# Patient Record
Sex: Female | Born: 1955 | Race: White | Hispanic: No | Marital: Married | Smoking: Never smoker
Health system: Southern US, Community
[De-identification: ages and names within clinical notes are randomized; demographics above are authoritative.]

## PROBLEM LIST (undated history)

## (undated) DIAGNOSIS — F419 Anxiety disorder, unspecified: Secondary | ICD-10-CM

## (undated) DIAGNOSIS — E2740 Unspecified adrenocortical insufficiency: Secondary | ICD-10-CM

## (undated) DIAGNOSIS — I219 Acute myocardial infarction, unspecified: Secondary | ICD-10-CM

## (undated) DIAGNOSIS — K219 Gastro-esophageal reflux disease without esophagitis: Secondary | ICD-10-CM

## (undated) DIAGNOSIS — E162 Hypoglycemia, unspecified: Secondary | ICD-10-CM

## (undated) DIAGNOSIS — M4802 Spinal stenosis, cervical region: Secondary | ICD-10-CM

## (undated) DIAGNOSIS — M1990 Unspecified osteoarthritis, unspecified site: Secondary | ICD-10-CM

## (undated) DIAGNOSIS — G459 Transient cerebral ischemic attack, unspecified: Secondary | ICD-10-CM

## (undated) DIAGNOSIS — T8859XA Other complications of anesthesia, initial encounter: Secondary | ICD-10-CM

## (undated) DIAGNOSIS — R7303 Prediabetes: Secondary | ICD-10-CM

## (undated) DIAGNOSIS — G4730 Sleep apnea, unspecified: Secondary | ICD-10-CM

## (undated) DIAGNOSIS — J189 Pneumonia, unspecified organism: Secondary | ICD-10-CM

## (undated) DIAGNOSIS — Z8719 Personal history of other diseases of the digestive system: Secondary | ICD-10-CM

## (undated) DIAGNOSIS — M4316 Spondylolisthesis, lumbar region: Secondary | ICD-10-CM

## (undated) DIAGNOSIS — F329 Major depressive disorder, single episode, unspecified: Secondary | ICD-10-CM

## (undated) DIAGNOSIS — E0590 Thyrotoxicosis, unspecified without thyrotoxic crisis or storm: Secondary | ICD-10-CM

## (undated) DIAGNOSIS — F32A Depression, unspecified: Secondary | ICD-10-CM

## (undated) DIAGNOSIS — M797 Fibromyalgia: Secondary | ICD-10-CM

## (undated) DIAGNOSIS — M2550 Pain in unspecified joint: Secondary | ICD-10-CM

## (undated) DIAGNOSIS — I10 Essential (primary) hypertension: Secondary | ICD-10-CM

## (undated) HISTORY — PX: OVARIAN CYST REMOVAL: SHX89

## (undated) HISTORY — PX: TONSILLECTOMY: SUR1361

## (undated) HISTORY — PX: TOE SURGERY: SHX1073

## (undated) HISTORY — PX: HIP ARTHROSCOPY W/ LABRAL REPAIR: SHX1750

## (undated) HISTORY — PX: DILATION AND CURETTAGE OF UTERUS: SHX78

## (undated) HISTORY — PX: APPENDECTOMY: SHX54

## (undated) HISTORY — PX: EYE SURGERY: SHX253

## (undated) HISTORY — PX: COLONOSCOPY: SHX174

## (undated) HISTORY — PX: JOINT REPLACEMENT: SHX530

## (undated) HISTORY — PX: CARDIAC CATHETERIZATION: SHX172

## (undated) HISTORY — PX: OOPHORECTOMY: SHX86

## (undated) HISTORY — PX: CHOLECYSTECTOMY: SHX55

## (undated) HISTORY — PX: BACK SURGERY: SHX140

## (undated) HISTORY — PX: ABDOMINAL HYSTERECTOMY: SHX81

---

## 2017-08-19 ENCOUNTER — Other Ambulatory Visit: Payer: Self-pay | Admitting: Neurosurgery

## 2017-09-13 NOTE — Pre-Procedure Instructions (Signed)
Felicia Ponce  09/13/2017      [facility] [id number] - [place], Felicia Ponce Phone: [phone] Fax: [phone]    Your procedure is scheduled on Monday, September 20, 2017  [facility] "A" at 6:00AM  Call this number if you have problems the morning of surgery:  [phone]   Remember:  Do not eat food or drink liquids after midnight.  Take these medicines the morning of surgery with A SIP OF WATER: Dexamethasone (DECADRON), Escitalopram (LEXAPRO), Methimazole (TAPAZOLE),  Orphenadrine (NORFLEX), Pantoprazole (PROTONIX), and RESTASIS. If needed HYDROcodone-acetaminophen (NORCO/VICODIN) for pain.  As of today, stop taking all Aspirins, Vitamins, Fish oils, and Herbal medications. Also stop all NSAIDS i.e. Advil, Ibuprofen, Motrin, Aleve, Anaprox, Naproxen, BC and Goody Powders.   Do not wear jewelry, make-up or nail polish.  Do not wear lotions, powders, perfumes, or deodorant.  Do not shave 48 hours prior to surgery.    Do not bring valuables to the hospital.  [facility] is not responsible for any belongings or valuables.  Contacts, dentures or bridgework may not be worn into surgery.  Leave your suitcase in the car.  After surgery it may be brought to your room.  For patients admitted to the hospital, discharge time will be determined by your treatment team.  Patients discharged the day of surgery will not be allowed to drive home.   Special instructions:  [facility]- Preparing For Surgery  Before surgery, you can play an important role. Because skin is not sterile, your skin needs to be as free of germs as possible. You can reduce the number of germs on your skin by washing with CHG (chlorahexidine gluconate) Soap before surgery.  CHG is an antiseptic cleaner which kills germs and bonds with the skin to continue killing germs even after  washing.  Please do not use if you have an allergy to CHG or antibacterial soaps. If your skin becomes reddened/irritated stop using the CHG.  Do not shave (including legs and underarms) for at least 48 hours prior to first CHG shower. It is OK to shave your face.  Please follow these instructions carefully.   1. Shower the NIGHT BEFORE SURGERY and the MORNING OF SURGERY with CHG.   2. If you chose to wash your hair, wash your hair first as usual with your normal shampoo.  3. After you shampoo, rinse your hair and body thoroughly to remove the shampoo.  4. Use CHG as you would any other liquid soap. You can apply CHG directly to the skin and wash gently with a scrungie or a clean washcloth.   5. Apply the CHG Soap to your body ONLY FROM THE NECK DOWN.  Do not use on open wounds or open sores. Avoid contact with your eyes, ears, mouth and genitals (private parts). Wash Face and genitals (private parts)  with your normal soap.  6. Wash thoroughly, paying special attention to the area where your surgery will be performed.  7. Thoroughly rinse your body with warm water from the neck down.  8. DO NOT shower/wash with your normal soap after using and rinsing off the CHG Soap.  9. Pat yourself dry with a CLEAN TOWEL.  10. Wear CLEAN PAJAMAS to bed the night before surgery, wear comfortable clothes the morning of surgery  11. Place CLEAN SHEETS on your bed  the night of your first shower and DO NOT SLEEP WITH PETS.  Day of Surgery: Do not apply any deodorants/lotions. Please wear clean clothes to the hospital/surgery center.    Please read over the following fact sheets that you were given. Pain Booklet, Coughing and Deep Breathing, MRSA Information and Surgical Site Infection Prevention

## 2017-09-14 ENCOUNTER — Other Ambulatory Visit: Payer: Self-pay

## 2017-09-14 ENCOUNTER — Encounter (HOSPITAL_COMMUNITY): Payer: Self-pay

## 2017-09-14 ENCOUNTER — Encounter (HOSPITAL_COMMUNITY)
Admission: RE | Admit: 2017-09-14 | Discharge: 2017-09-14 | Disposition: A | Discharge status: Home / Self Care | Payer: Medicare Other | Source: Ambulatory Visit | Attending: Neurosurgery | Admitting: Neurosurgery

## 2017-09-14 DIAGNOSIS — I10 Essential (primary) hypertension: Secondary | ICD-10-CM | POA: Insufficient documentation

## 2017-09-14 DIAGNOSIS — I2510 Atherosclerotic heart disease of native coronary artery without angina pectoris: Secondary | ICD-10-CM | POA: Insufficient documentation

## 2017-09-14 DIAGNOSIS — Z8673 Personal history of transient ischemic attack (TIA), and cerebral infarction without residual deficits: Secondary | ICD-10-CM | POA: Diagnosis not present

## 2017-09-14 DIAGNOSIS — K449 Diaphragmatic hernia without obstruction or gangrene: Secondary | ICD-10-CM | POA: Diagnosis not present

## 2017-09-14 DIAGNOSIS — K219 Gastro-esophageal reflux disease without esophagitis: Secondary | ICD-10-CM | POA: Insufficient documentation

## 2017-09-14 DIAGNOSIS — Z01812 Encounter for preprocedural laboratory examination: Secondary | ICD-10-CM | POA: Insufficient documentation

## 2017-09-14 DIAGNOSIS — F329 Major depressive disorder, single episode, unspecified: Secondary | ICD-10-CM | POA: Diagnosis not present

## 2017-09-14 DIAGNOSIS — E0590 Thyrotoxicosis, unspecified without thyrotoxic crisis or storm: Secondary | ICD-10-CM | POA: Insufficient documentation

## 2017-09-14 DIAGNOSIS — G4730 Sleep apnea, unspecified: Secondary | ICD-10-CM | POA: Insufficient documentation

## 2017-09-14 DIAGNOSIS — Z79899 Other long term (current) drug therapy: Secondary | ICD-10-CM | POA: Insufficient documentation

## 2017-09-14 HISTORY — DX: Sleep apnea, unspecified: G47.30

## 2017-09-14 HISTORY — DX: Depression, unspecified: F32.A

## 2017-09-14 HISTORY — DX: Gastro-esophageal reflux disease without esophagitis: K21.9

## 2017-09-14 HISTORY — DX: Unspecified osteoarthritis, unspecified site: M19.90

## 2017-09-14 HISTORY — DX: Fibromyalgia: M79.7

## 2017-09-14 HISTORY — DX: Transient cerebral ischemic attack, unspecified: G45.9

## 2017-09-14 HISTORY — DX: Hypoglycemia, unspecified: E16.2

## 2017-09-14 HISTORY — DX: Spondylolisthesis, lumbar region: M43.16

## 2017-09-14 HISTORY — DX: Acute myocardial infarction, unspecified: I21.9

## 2017-09-14 HISTORY — DX: Personal history of other diseases of the digestive system: Z87.19

## 2017-09-14 HISTORY — DX: Major depressive disorder, single episode, unspecified: F32.9

## 2017-09-14 HISTORY — DX: Essential (primary) hypertension: I10

## 2017-09-14 HISTORY — DX: Thyrotoxicosis, unspecified without thyrotoxic crisis or storm: E05.90

## 2017-09-14 HISTORY — DX: Pneumonia, unspecified organism: J18.9

## 2017-09-14 HISTORY — DX: Unspecified adrenocortical insufficiency: E27.40

## 2017-09-14 LAB — CBC WITH DIFFERENTIAL/PLATELET
Basophils Absolute: 0.1 10*3/uL (ref 0.0–0.1)
Basophils Relative: 1 %
EOS ABS: 0.1 10*3/uL (ref 0.0–0.7)
EOS PCT: 1 %
HCT: 45 % (ref 36.0–46.0)
Hemoglobin: 14.7 g/dL (ref 12.0–15.0)
LYMPHS ABS: 2.2 10*3/uL (ref 0.7–4.0)
Lymphocytes Relative: 17 %
MCH: 29.5 pg (ref 26.0–34.0)
MCHC: 32.7 g/dL (ref 30.0–36.0)
MCV: 90.2 fL (ref 78.0–100.0)
MONOS PCT: 13 %
Monocytes Absolute: 1.7 10*3/uL — ABNORMAL HIGH (ref 0.1–1.0)
Neutro Abs: 9 10*3/uL — ABNORMAL HIGH (ref 1.7–7.7)
Neutrophils Relative %: 68 %
PLATELETS: 290 10*3/uL (ref 150–400)
RBC: 4.99 MIL/uL (ref 3.87–5.11)
RDW: 13.7 % (ref 11.5–15.5)
WBC: 13.1 10*3/uL — AB (ref 4.0–10.5)

## 2017-09-14 LAB — BASIC METABOLIC PANEL
ANION GAP: 9 (ref 5–15)
BUN: 21 mg/dL — ABNORMAL HIGH (ref 6–20)
CHLORIDE: 106 mmol/L (ref 101–111)
CO2: 26 mmol/L (ref 22–32)
Calcium: 8.8 mg/dL — ABNORMAL LOW (ref 8.9–10.3)
Creatinine, Ser: 1.1 mg/dL — ABNORMAL HIGH (ref 0.44–1.00)
GFR calc non Af Amer: 52 mL/min — ABNORMAL LOW (ref >60)
Glucose, Bld: 81 mg/dL (ref 65–99)
Potassium: 3.6 mmol/L (ref 3.5–5.1)
Sodium: 141 mmol/L (ref 135–145)

## 2017-09-14 LAB — SURGICAL PCR SCREEN
MRSA, PCR: NEGATIVE
Staphylococcus aureus: POSITIVE — AB

## 2017-09-14 LAB — TYPE AND SCREEN
ABO/RH(D): A POS
Antibody Screen: NEGATIVE

## 2017-09-14 LAB — ABO/RH: ABO/RH(D): A POS

## 2017-09-14 NOTE — Progress Notes (Signed)
Pt notified of +Staph results. [facility] [phone].

## 2017-09-14 NOTE — Progress Notes (Addendum)
PCP - Dr. Floyde ParkinsNewmark  Cardiologist - Dr. Eula ListenVybrial-Requested Cardiac Clearance  Endocrine-Dr. Demetra ShinerGulley  Chest x-ray - 05/26/17 (CE)-Requested  EKG - 05/26/17 (CE) -Requested  Stress Test - 05/27/17 (CE)-Requested  ECHO - Denies  Cardiac Cath - 4-5 yrs ago, Can not remember where it was done, but sts it was negative for blockage  Sleep Study - Yes CPAP - Pt has CPAP machine, but can not tolerate it.  LABS-Pending: CBC w/D, BMP, T/S  Chart will be given to anesthesia for review due to medical history and requested records.  Pt denies having chest pain, sob, or fever at this time. All instructions explained to the pt, with a verbal understanding of the material. Pt agrees to go over the instructions while at home for a better understanding. The opportunity to ask questions was provided.

## 2017-09-15 NOTE — Progress Notes (Addendum)
Anesthesia Chart Review:  Pt is a 61 year old female scheduled for L4-5 PLIF on 09/20/2017 with Julio SicksHenry Pool, MD.   - Endocrinologist is Santiago GladPaul Gulley, MD who cleared pt for surgery noting "in holding area before surgery she should be given 50 mg hydrocortisone IV and then 25 mg every 8 hours for 24 hours. On day following surgery if she is taking oral meds she can just resume her usual replacement steroids. If she remains acutely ill for example persistent nausea or fever, then continue 25 mg hydrocortisone IV twice daily until her acute illness resolves and resume her baseline replacement"  - Cardiologist is Frazier Butthomas Vybiral, MD who has cleared pt for surgery.   PMH includes:  HTN, TIA, hyperthyroidism, adrenal insufficiency, OSA, GERD. Never smoker. BMI 33  - [facility]. Stress test normal.   Medications include: Dexamethasone, methimazole, metoprolol, Protonix  BP 132/69   Pulse 67   Temp 36.7 C   Resp 20   Ht 5\' 6"  (1.676 m)   Wt 205 lb 6.4 oz (93.2 kg)   SpO2 96%   BMI 33.15 kg/m   Preoperative labs reviewed.    1 view CXR 05/26/17 (care everywhere):  - No active intrathoracic disease.  EKG 05/26/17 [facility]):  - NSR. Inferior infarct, age undetermined - Report in care everywhere.  Tracking requested.   Nuclear stress test 05/27/17 (care everywhere):  - Exam within normal limits.  Echo 04/22/17 [facility]):  1. Normal LV size and systolic function. LVH. Diastolic dysfunction. 2. Mild mitral regurgitation. 3. Moderate tricuspid regurgitation. 4. Mild to moderate pulmonary hypertension.  Cardiac cath 01/16/13 (care everywhere):  - Mild non-obstructive ASCAD (mid RCA 10%) - Ejection Fraction: 60% - Wall Motion: Normal   If no changes, I anticipate pt can proceed with surgery as scheduled.   Rica Mastngela Tracker Mance, FNP-BC [facility]Anesthesiology Phone:  [phone] 09/15/2017 1:12 PM

## 2017-09-20 ENCOUNTER — Inpatient Hospital Stay (HOSPITAL_COMMUNITY): Payer: Medicare Other

## 2017-09-20 ENCOUNTER — Inpatient Hospital Stay (HOSPITAL_COMMUNITY)
Admission: RE | Admit: 2017-09-20 | Discharge: 2017-09-22 | DRG: 454 | Disposition: A | Discharge status: Home / Self Care | Payer: Medicare Other | Source: Ambulatory Visit | Attending: Neurosurgery | Admitting: Neurosurgery

## 2017-09-20 ENCOUNTER — Inpatient Hospital Stay (HOSPITAL_COMMUNITY): Payer: Medicare Other | Admitting: Emergency Medicine

## 2017-09-20 ENCOUNTER — Encounter (HOSPITAL_COMMUNITY)
Admission: RE | Disposition: A | Discharge status: Home / Self Care | Payer: Self-pay | Source: Ambulatory Visit | Attending: Neurosurgery

## 2017-09-20 ENCOUNTER — Encounter (HOSPITAL_COMMUNITY): Payer: Self-pay | Admitting: *Deleted

## 2017-09-20 DIAGNOSIS — Z79899 Other long term (current) drug therapy: Secondary | ICD-10-CM | POA: Diagnosis not present

## 2017-09-20 DIAGNOSIS — E2740 Unspecified adrenocortical insufficiency: Secondary | ICD-10-CM | POA: Diagnosis present

## 2017-09-20 DIAGNOSIS — Z888 Allergy status to other drugs, medicaments and biological substances status: Secondary | ICD-10-CM

## 2017-09-20 DIAGNOSIS — Z886 Allergy status to analgesic agent status: Secondary | ICD-10-CM | POA: Diagnosis not present

## 2017-09-20 DIAGNOSIS — M4310 Spondylolisthesis, site unspecified: Secondary | ICD-10-CM | POA: Diagnosis present

## 2017-09-20 DIAGNOSIS — Z91040 Latex allergy status: Secondary | ICD-10-CM | POA: Diagnosis not present

## 2017-09-20 DIAGNOSIS — Z881 Allergy status to other antibiotic agents status: Secondary | ICD-10-CM

## 2017-09-20 DIAGNOSIS — M4316 Spondylolisthesis, lumbar region: Secondary | ICD-10-CM | POA: Diagnosis present

## 2017-09-20 DIAGNOSIS — G4730 Sleep apnea, unspecified: Secondary | ICD-10-CM | POA: Diagnosis present

## 2017-09-20 DIAGNOSIS — I252 Old myocardial infarction: Secondary | ICD-10-CM

## 2017-09-20 DIAGNOSIS — Z882 Allergy status to sulfonamides status: Secondary | ICD-10-CM | POA: Diagnosis not present

## 2017-09-20 DIAGNOSIS — Z90710 Acquired absence of both cervix and uterus: Secondary | ICD-10-CM | POA: Diagnosis not present

## 2017-09-20 DIAGNOSIS — Z8673 Personal history of transient ischemic attack (TIA), and cerebral infarction without residual deficits: Secondary | ICD-10-CM

## 2017-09-20 DIAGNOSIS — K219 Gastro-esophageal reflux disease without esophagitis: Secondary | ICD-10-CM | POA: Diagnosis present

## 2017-09-20 DIAGNOSIS — I10 Essential (primary) hypertension: Secondary | ICD-10-CM | POA: Diagnosis present

## 2017-09-20 DIAGNOSIS — Z880 Allergy status to penicillin: Secondary | ICD-10-CM

## 2017-09-20 DIAGNOSIS — Z419 Encounter for procedure for purposes other than remedying health state, unspecified: Secondary | ICD-10-CM

## 2017-09-20 SURGERY — POSTERIOR LUMBAR FUSION 1 LEVEL
Anesthesia: General | Site: Back

## 2017-09-20 MED ORDER — ORPHENADRINE CITRATE ER 100 MG PO TB12
100.0000 mg | ORAL_TABLET | Freq: Two times a day (BID) | ORAL | Status: DC
  Administered 2017-09-20 – 2017-09-22 (×4): 100 mg via ORAL
  Filled 2017-09-20 (×4): qty 1

## 2017-09-20 MED ORDER — DIAZEPAM 5 MG PO TABS
ORAL_TABLET | ORAL | Status: AC
  Filled 2017-09-20: qty 2

## 2017-09-20 MED ORDER — HYDROCORTISONE NA SUCCINATE PF 100 MG IJ SOLR
50.0000 mg | Freq: Three times a day (TID) | INTRAMUSCULAR | Status: AC
  Administered 2017-09-20 – 2017-09-21 (×3): 50 mg via INTRAVENOUS
  Filled 2017-09-20 (×3): qty 2

## 2017-09-20 MED ORDER — PANTOPRAZOLE SODIUM 40 MG PO TBEC
40.0000 mg | DELAYED_RELEASE_TABLET | Freq: Two times a day (BID) | ORAL | Status: DC
  Administered 2017-09-20 – 2017-09-22 (×4): 40 mg via ORAL
  Filled 2017-09-20 (×4): qty 1

## 2017-09-20 MED ORDER — POLYVINYL ALCOHOL 1.4 % OP SOLN
1.0000 [drp] | Freq: Every day | OPHTHALMIC | Status: DC
  Administered 2017-09-21: 1 [drp] via OPHTHALMIC
  Filled 2017-09-20: qty 15

## 2017-09-20 MED ORDER — LACTATED RINGERS IV SOLN
INTRAVENOUS | Status: DC | PRN
  Administered 2017-09-20 (×2): via INTRAVENOUS

## 2017-09-20 MED ORDER — LACTATED RINGERS IV SOLN
Freq: Once | INTRAVENOUS | Status: AC
  Administered 2017-09-20: 07:00:00 via INTRAVENOUS

## 2017-09-20 MED ORDER — DIPHENHYDRAMINE HCL 25 MG PO CAPS
25.0000 mg | ORAL_CAPSULE | Freq: Four times a day (QID) | ORAL | Status: DC | PRN
  Administered 2017-09-20: 25 mg via ORAL
  Filled 2017-09-20: qty 1

## 2017-09-20 MED ORDER — HYDROMORPHONE HCL 1 MG/ML IJ SOLN
INTRAMUSCULAR | Status: AC
  Administered 2017-09-20: 11:00:00
  Filled 2017-09-20: qty 1

## 2017-09-20 MED ORDER — EPHEDRINE 5 MG/ML INJ
INTRAVENOUS | Status: AC
  Filled 2017-09-20: qty 10

## 2017-09-20 MED ORDER — BISACODYL 10 MG RE SUPP: 10.0000 mg | Freq: Every day | RECTAL | Status: DC | PRN

## 2017-09-20 MED ORDER — ONDANSETRON HCL 4 MG/2ML IJ SOLN: 4.0000 mg | Freq: Four times a day (QID) | INTRAMUSCULAR | Status: DC | PRN

## 2017-09-20 MED ORDER — PHENYLEPHRINE HCL 10 MG/ML IJ SOLN
INTRAMUSCULAR | Status: DC | PRN
  Administered 2017-09-20: 20 ug/min via INTRAVENOUS

## 2017-09-20 MED ORDER — MIDAZOLAM HCL 5 MG/5ML IJ SOLN
INTRAMUSCULAR | Status: DC | PRN
Start: 2017-09-20 — End: 2017-09-20
  Administered 2017-09-20: 2 mg via INTRAVENOUS

## 2017-09-20 MED ORDER — MELATONIN 3 MG PO TABS
9.0000 mg | ORAL_TABLET | Freq: Every day | ORAL | Status: DC
  Administered 2017-09-20 – 2017-09-21 (×2): 9 mg via ORAL
  Filled 2017-09-20 (×2): qty 3

## 2017-09-20 MED ORDER — OXYCODONE HCL 5 MG PO TABS
10.0000 mg | ORAL_TABLET | ORAL | Status: DC | PRN
  Administered 2017-09-20 – 2017-09-21 (×6): 10 mg via ORAL
  Filled 2017-09-20 (×6): qty 2

## 2017-09-20 MED ORDER — ROCURONIUM BROMIDE 100 MG/10ML IV SOLN
INTRAVENOUS | Status: DC | PRN
  Administered 2017-09-20: 50 mg via INTRAVENOUS

## 2017-09-20 MED ORDER — PROMETHAZINE HCL 25 MG/ML IJ SOLN: 6.2500 mg | INTRAMUSCULAR | Status: DC | PRN

## 2017-09-20 MED ORDER — DIAZEPAM 5 MG PO TABS
5.0000 mg | ORAL_TABLET | Freq: Four times a day (QID) | ORAL | Status: DC | PRN
  Administered 2017-09-20: 5 mg via ORAL
  Administered 2017-09-21 – 2017-09-22 (×3): 10 mg via ORAL
  Filled 2017-09-20: qty 1
  Filled 2017-09-20 (×3): qty 2

## 2017-09-20 MED ORDER — DEXAMETHASONE SODIUM PHOSPHATE 10 MG/ML IJ SOLN
10.0000 mg | INTRAMUSCULAR | Status: AC
  Administered 2017-09-20: 10 mg via INTRAVENOUS
  Filled 2017-09-20: qty 1

## 2017-09-20 MED ORDER — MIDAZOLAM HCL 2 MG/2ML IJ SOLN
INTRAMUSCULAR | Status: AC
  Filled 2017-09-20: qty 2

## 2017-09-20 MED ORDER — ONDANSETRON HCL 4 MG/2ML IJ SOLN
INTRAMUSCULAR | Status: AC
  Filled 2017-09-20: qty 2

## 2017-09-20 MED ORDER — MENTHOL 3 MG MT LOZG: 1.0000 | LOZENGE | OROMUCOSAL | Status: DC | PRN

## 2017-09-20 MED ORDER — CYCLOSPORINE 0.05 % OP EMUL
1.0000 [drp] | Freq: Two times a day (BID) | OPHTHALMIC | Status: DC
  Administered 2017-09-20 – 2017-09-22 (×4): 1 [drp] via OPHTHALMIC
  Filled 2017-09-20 (×4): qty 1

## 2017-09-20 MED ORDER — ONDANSETRON HCL 4 MG/2ML IJ SOLN
INTRAMUSCULAR | Status: DC | PRN
  Administered 2017-09-20: 4 mg via INTRAVENOUS

## 2017-09-20 MED ORDER — CHLORHEXIDINE GLUCONATE CLOTH 2 % EX PADS: 6.0000 | MEDICATED_PAD | Freq: Once | CUTANEOUS | Status: DC

## 2017-09-20 MED ORDER — DOXEPIN HCL 25 MG PO CAPS
25.0000 mg | ORAL_CAPSULE | Freq: Every evening | ORAL | Status: DC | PRN
  Administered 2017-09-21: 25 mg via ORAL
  Filled 2017-09-20: qty 1

## 2017-09-20 MED ORDER — HYDROCORTISONE NA SUCCINATE PF 100 MG IJ SOLR
INTRAMUSCULAR | Status: DC | PRN
  Administered 2017-09-20: 50 mg via INTRAVENOUS

## 2017-09-20 MED ORDER — METOPROLOL SUCCINATE ER 50 MG PO TB24
50.0000 mg | ORAL_TABLET | Freq: Every evening | ORAL | Status: DC
  Administered 2017-09-20 – 2017-09-21 (×2): 50 mg via ORAL
  Filled 2017-09-20 (×2): qty 1

## 2017-09-20 MED ORDER — VANCOMYCIN HCL 1000 MG IV SOLR
INTRAVENOUS | Status: DC | PRN
  Administered 2017-09-20: 1000 mg via TOPICAL

## 2017-09-20 MED ORDER — SODIUM CHLORIDE 0.9% FLUSH: 3.0000 mL | Freq: Two times a day (BID) | INTRAVENOUS | Status: DC

## 2017-09-20 MED ORDER — THROMBIN (RECOMBINANT) 20000 UNITS EX SOLR
CUTANEOUS | Status: DC | PRN
  Administered 2017-09-20: 20 mL via TOPICAL

## 2017-09-20 MED ORDER — ONDANSETRON HCL 4 MG PO TABS: 4.0000 mg | ORAL_TABLET | Freq: Four times a day (QID) | ORAL | Status: DC | PRN

## 2017-09-20 MED ORDER — STERILE WATER FOR IRRIGATION IR SOLN
Status: DC | PRN
  Administered 2017-09-20: 1000 mL

## 2017-09-20 MED ORDER — OXYCODONE HCL 5 MG/5ML PO SOLN
5.0000 mg | Freq: Once | ORAL | Status: DC | PRN
Start: 2017-09-20 — End: 2017-09-20

## 2017-09-20 MED ORDER — EPHEDRINE SULFATE 50 MG/ML IJ SOLN
INTRAMUSCULAR | Status: DC | PRN
  Administered 2017-09-20: 10 mg via INTRAVENOUS

## 2017-09-20 MED ORDER — BUPIVACAINE HCL (PF) 0.25 % IJ SOLN
INTRAMUSCULAR | Status: DC | PRN
  Administered 2017-09-20: 30 mL

## 2017-09-20 MED ORDER — VANCOMYCIN HCL 1000 MG IV SOLR
INTRAVENOUS | Status: DC | PRN
  Administered 2017-09-20: 1000 mg via INTRAVENOUS

## 2017-09-20 MED ORDER — PHENOL 1.4 % MT LIQD: 1.0000 | OROMUCOSAL | Status: DC | PRN

## 2017-09-20 MED ORDER — OXYCODONE HCL 5 MG PO TABS
ORAL_TABLET | ORAL | Status: AC
  Administered 2017-09-20: 5 mg
  Filled 2017-09-20: qty 2

## 2017-09-20 MED ORDER — LIDOCAINE HCL (CARDIAC) 20 MG/ML IV SOLN
INTRAVENOUS | Status: DC | PRN
  Administered 2017-09-20: 100 mg via INTRAVENOUS

## 2017-09-20 MED ORDER — HYDROMORPHONE HCL 1 MG/ML IJ SOLN
0.2500 mg | INTRAMUSCULAR | Status: DC | PRN
  Administered 2017-09-20 (×4): 0.5 mg via INTRAVENOUS

## 2017-09-20 MED ORDER — SUGAMMADEX SODIUM 200 MG/2ML IV SOLN
INTRAVENOUS | Status: DC | PRN
  Administered 2017-09-20: 200 mg via INTRAVENOUS

## 2017-09-20 MED ORDER — HYDROCODONE-ACETAMINOPHEN 10-325 MG PO TABS
1.0000 | ORAL_TABLET | ORAL | Status: DC | PRN
  Administered 2017-09-21 – 2017-09-22 (×2): 1 via ORAL
  Filled 2017-09-20 (×2): qty 1

## 2017-09-20 MED ORDER — THROMBIN (RECOMBINANT) 20000 UNITS EX SOLR
CUTANEOUS | Status: AC
  Filled 2017-09-20: qty 20000

## 2017-09-20 MED ORDER — DEXAMETHASONE 0.75 MG PO TABS
0.7500 mg | ORAL_TABLET | Freq: Every day | ORAL | Status: DC
  Administered 2017-09-20 – 2017-09-22 (×3): 0.75 mg via ORAL
  Filled 2017-09-20 (×3): qty 1

## 2017-09-20 MED ORDER — VANCOMYCIN HCL 1000 MG IV SOLR
INTRAVENOUS | Status: AC
  Filled 2017-09-20: qty 1000

## 2017-09-20 MED ORDER — POLYETHYLENE GLYCOL 3350 17 G PO PACK
17.0000 g | PACK | Freq: Every day | ORAL | Status: DC | PRN
  Administered 2017-09-22: 17 g via ORAL
  Filled 2017-09-20: qty 1

## 2017-09-20 MED ORDER — VANCOMYCIN HCL IN DEXTROSE 1-5 GM/200ML-% IV SOLN
INTRAVENOUS | Status: AC
  Filled 2017-09-20: qty 200

## 2017-09-20 MED ORDER — SODIUM CHLORIDE 0.9 % IR SOLN
Status: DC | PRN
  Administered 2017-09-20: 500 mL

## 2017-09-20 MED ORDER — OXYCODONE HCL 5 MG PO TABS
ORAL_TABLET | ORAL | Status: AC
  Administered 2017-09-20: 11:00:00
  Filled 2017-09-20: qty 2

## 2017-09-20 MED ORDER — FENTANYL CITRATE (PF) 100 MCG/2ML IJ SOLN
INTRAMUSCULAR | Status: DC | PRN
  Administered 2017-09-20 (×2): 25 ug via INTRAVENOUS
  Administered 2017-09-20: 75 ug via INTRAVENOUS
  Administered 2017-09-20: 25 ug via INTRAVENOUS
  Administered 2017-09-20: 50 ug via INTRAVENOUS

## 2017-09-20 MED ORDER — 0.9 % SODIUM CHLORIDE (POUR BTL) OPTIME
TOPICAL | Status: DC | PRN
  Administered 2017-09-20: 1000 mL

## 2017-09-20 MED ORDER — BUPIVACAINE HCL (PF) 0.25 % IJ SOLN
INTRAMUSCULAR | Status: AC
  Filled 2017-09-20: qty 30

## 2017-09-20 MED ORDER — HYDROMORPHONE HCL 1 MG/ML IJ SOLN
1.0000 mg | INTRAMUSCULAR | Status: DC | PRN
  Administered 2017-09-20 – 2017-09-21 (×4): 1 mg via INTRAVENOUS
  Filled 2017-09-20 (×4): qty 1

## 2017-09-20 MED ORDER — SODIUM CHLORIDE 0.9% FLUSH: 3.0000 mL | INTRAVENOUS | Status: DC | PRN

## 2017-09-20 MED ORDER — PROPOFOL 10 MG/ML IV BOLUS
INTRAVENOUS | Status: DC | PRN
  Administered 2017-09-20: 140 mg via INTRAVENOUS

## 2017-09-20 MED ORDER — OXYCODONE HCL 5 MG PO TABS: 5.0000 mg | ORAL_TABLET | Freq: Once | ORAL | Status: DC | PRN

## 2017-09-20 MED ORDER — METHIMAZOLE 5 MG PO TABS
5.0000 mg | ORAL_TABLET | Freq: Every day | ORAL | Status: DC
  Administered 2017-09-21 – 2017-09-22 (×2): 5 mg via ORAL
  Filled 2017-09-20 (×2): qty 1

## 2017-09-20 MED ORDER — ACETAMINOPHEN 325 MG PO TABS: 650.0000 mg | ORAL_TABLET | ORAL | Status: DC | PRN

## 2017-09-20 MED ORDER — FLEET ENEMA 7-19 GM/118ML RE ENEM: 1.0000 | ENEMA | Freq: Once | RECTAL | Status: DC | PRN

## 2017-09-20 MED ORDER — SODIUM CHLORIDE 0.9 % IV SOLN: 250.0000 mL | INTRAVENOUS | Status: DC

## 2017-09-20 MED ORDER — ACETAMINOPHEN 650 MG RE SUPP: 650.0000 mg | RECTAL | Status: DC | PRN

## 2017-09-20 MED ORDER — CALCIUM CARBONATE-VITAMIN D 500-200 MG-UNIT PO TABS
1.0000 | ORAL_TABLET | Freq: Every day | ORAL | Status: DC
  Administered 2017-09-21 – 2017-09-22 (×2): 1 via ORAL
  Filled 2017-09-20 (×3): qty 1

## 2017-09-20 MED ORDER — ESCITALOPRAM OXALATE 10 MG PO TABS
10.0000 mg | ORAL_TABLET | Freq: Every day | ORAL | Status: DC
  Administered 2017-09-21 – 2017-09-22 (×2): 10 mg via ORAL
  Filled 2017-09-20 (×2): qty 1

## 2017-09-20 MED ORDER — VANCOMYCIN HCL IN DEXTROSE 1-5 GM/200ML-% IV SOLN
1000.0000 mg | Freq: Once | INTRAVENOUS | Status: AC
  Administered 2017-09-20: 1000 mg via INTRAVENOUS
  Filled 2017-09-20: qty 200

## 2017-09-20 MED ORDER — PROPOFOL 10 MG/ML IV BOLUS
INTRAVENOUS | Status: AC
  Filled 2017-09-20: qty 20

## 2017-09-20 MED ORDER — GABAPENTIN 300 MG PO CAPS
300.0000 mg | ORAL_CAPSULE | Freq: Every day | ORAL | Status: DC
  Administered 2017-09-20 – 2017-09-21 (×2): 300 mg via ORAL
  Filled 2017-09-20 (×2): qty 1

## 2017-09-20 MED ORDER — FENTANYL CITRATE (PF) 250 MCG/5ML IJ SOLN
INTRAMUSCULAR | Status: AC
  Filled 2017-09-20: qty 5

## 2017-09-20 MED ORDER — SUCCINYLCHOLINE CHLORIDE 200 MG/10ML IV SOSY
PREFILLED_SYRINGE | INTRAVENOUS | Status: AC
  Filled 2017-09-20: qty 10

## 2017-09-20 SURGICAL SUPPLY — 70 items
BAG DECANTER FOR FLEXI CONT (MISCELLANEOUS) ×3 IMPLANT
BENZOIN TINCTURE PRP APPL 2/3 (GAUZE/BANDAGES/DRESSINGS) ×3 IMPLANT
BLADE CLIPPER SURG (BLADE) IMPLANT
BUR CUTTER 7.0 ROUND (BURR) IMPLANT
BUR MATCHSTICK NEURO 3.0 LAGG (BURR) ×3 IMPLANT
CANISTER SUCT 3000ML PPV (MISCELLANEOUS) ×3 IMPLANT
CAP LCK SPNE (Orthopedic Implant) ×4 IMPLANT
CAP LOCK SPINE RADIUS (Orthopedic Implant) ×4 IMPLANT
CAP LOCKING (Orthopedic Implant) ×8 IMPLANT
CARTRIDGE OIL MAESTRO DRILL (MISCELLANEOUS) ×1 IMPLANT
CLOSURE STERI-STRIP 1/2X4 (GAUZE/BANDAGES/DRESSINGS) ×1
CLOSURE WOUND 1/2 X4 (GAUZE/BANDAGES/DRESSINGS) ×2
CLSR STERI-STRIP ANTIMIC 1/2X4 (GAUZE/BANDAGES/DRESSINGS) ×2 IMPLANT
CONT SPEC 4OZ CLIKSEAL STRL BL (MISCELLANEOUS) ×3 IMPLANT
COVER BACK TABLE 60X90IN (DRAPES) ×3 IMPLANT
DECANTER SPIKE VIAL GLASS SM (MISCELLANEOUS) ×3 IMPLANT
DERMABOND ADVANCED (GAUZE/BANDAGES/DRESSINGS) ×2
DERMABOND ADVANCED .7 DNX12 (GAUZE/BANDAGES/DRESSINGS) ×1 IMPLANT
DEVICE INTERBODY ELEVATE 23X9 (Cage) ×6 IMPLANT
DIFFUSER DRILL AIR PNEUMATIC (MISCELLANEOUS) ×3 IMPLANT
DRAPE C-ARM 42X72 X-RAY (DRAPES) ×6 IMPLANT
DRAPE HALF SHEET 40X57 (DRAPES) IMPLANT
DRAPE LAPAROTOMY 100X72X124 (DRAPES) ×3 IMPLANT
DRAPE POUCH INSTRU U-SHP 10X18 (DRAPES) ×3 IMPLANT
DRAPE SURG 17X23 STRL (DRAPES) ×12 IMPLANT
DRSG OPSITE POSTOP 4X6 (GAUZE/BANDAGES/DRESSINGS) ×3 IMPLANT
DURAPREP 26ML APPLICATOR (WOUND CARE) ×3 IMPLANT
ELECT REM PT RETURN 9FT ADLT (ELECTROSURGICAL) ×3
ELECTRODE REM PT RTRN 9FT ADLT (ELECTROSURGICAL) ×1 IMPLANT
EVACUATOR 1/8 PVC DRAIN (DRAIN) IMPLANT
GAUZE SPONGE 4X4 12PLY STRL (GAUZE/BANDAGES/DRESSINGS) IMPLANT
GAUZE SPONGE 4X4 16PLY XRAY LF (GAUZE/BANDAGES/DRESSINGS) IMPLANT
GLOVE BIOGEL PI IND STRL 6.5 (GLOVE) ×2 IMPLANT
GLOVE BIOGEL PI INDICATOR 6.5 (GLOVE) ×4
GLOVE ECLIPSE 9.0 STRL (GLOVE) IMPLANT
GLOVE EXAM NITRILE LRG STRL (GLOVE) IMPLANT
GLOVE EXAM NITRILE XL STR (GLOVE) IMPLANT
GLOVE EXAM NITRILE XS STR PU (GLOVE) ×6 IMPLANT
GLOVE SS PI 9.0 STRL (GLOVE) ×6 IMPLANT
GLOVE SURG SS PI 6.5 STRL IVOR (GLOVE) ×6 IMPLANT
GLOVE SURG SS PI 8.0 STRL IVOR (GLOVE) ×3 IMPLANT
GOWN STRL REUS W/ TWL LRG LVL3 (GOWN DISPOSABLE) ×1 IMPLANT
GOWN STRL REUS W/ TWL XL LVL3 (GOWN DISPOSABLE) ×3 IMPLANT
GOWN STRL REUS W/TWL 2XL LVL3 (GOWN DISPOSABLE) IMPLANT
GOWN STRL REUS W/TWL LRG LVL3 (GOWN DISPOSABLE) ×2
GOWN STRL REUS W/TWL XL LVL3 (GOWN DISPOSABLE) ×6
GRAFT BN 5X1XSPNE CVD POST DBM (Bone Implant) ×1 IMPLANT
GRAFT BONE MAGNIFUSE 1X5CM (Bone Implant) ×2 IMPLANT
KIT BASIN OR (CUSTOM PROCEDURE TRAY) ×3 IMPLANT
KIT ROOM TURNOVER OR (KITS) ×3 IMPLANT
MILL MEDIUM DISP (BLADE) ×3 IMPLANT
NEEDLE HYPO 22GX1.5 SAFETY (NEEDLE) ×3 IMPLANT
NEEDLE SPNL 22GX3.5 QUINCKE BK (NEEDLE) ×3 IMPLANT
NS IRRIG 1000ML POUR BTL (IV SOLUTION) ×3 IMPLANT
OIL CARTRIDGE MAESTRO DRILL (MISCELLANEOUS) ×3
PACK LAMINECTOMY NEURO (CUSTOM PROCEDURE TRAY) ×3 IMPLANT
ROD RADIUS 45MM (Rod) ×4 IMPLANT
ROD SPNL 45X5.5XNS TI RDS (Rod) ×2 IMPLANT
SCREW 5.75X40M (Screw) ×6 IMPLANT
SCREW 5.75X45MM (Screw) ×6 IMPLANT
SPONGE SURGIFOAM ABS GEL 100 (HEMOSTASIS) ×3 IMPLANT
STRIP CLOSURE SKIN 1/2X4 (GAUZE/BANDAGES/DRESSINGS) ×4 IMPLANT
SUT VIC AB 0 CT1 18XCR BRD8 (SUTURE) ×2 IMPLANT
SUT VIC AB 0 CT1 8-18 (SUTURE) ×4
SUT VIC AB 2-0 CT1 18 (SUTURE) ×3 IMPLANT
SUT VIC AB 3-0 SH 8-18 (SUTURE) ×3 IMPLANT
TOWEL GREEN STERILE (TOWEL DISPOSABLE) ×3 IMPLANT
TOWEL GREEN STERILE FF (TOWEL DISPOSABLE) ×3 IMPLANT
TRAY FOLEY BAG SILVER LF 16FR (SET/KITS/TRAYS/PACK) ×3 IMPLANT
WATER STERILE IRR 1000ML POUR (IV SOLUTION) ×3 IMPLANT

## 2017-09-20 NOTE — Anesthesia Procedure Notes (Signed)
Procedure Name: Intubation Date/Time: 09/20/2017 8:11 AM Performed by: Rogelia BogaMueller, Taila Basinski P, CRNA Pre-anesthesia Checklist: Patient identified, Emergency Drugs available, Suction available and Patient being monitored Patient Re-evaluated:Patient Re-evaluated prior to induction Oxygen Delivery Method: Circle System Utilized Preoxygenation: Pre-oxygenation with 100% oxygen Induction Type: IV induction Ventilation: Mask ventilation without difficulty Laryngoscope Size: Glidescope Grade View: Grade I Tube type: Oral Tube size: 7.0 mm Number of attempts: 1 Airway Equipment and Method: Stylet and Oral airway Placement Confirmation: ETT inserted through vocal cords under direct vision,  positive ETCO2 and breath sounds checked- equal and bilateral Secured at: 22 cm Tube secured with: Tape Dental Injury: Teeth and Oropharynx as per pre-operative assessment  Comments: Intubation per Armida SansMeredith Smith, SRNA

## 2017-09-20 NOTE — Progress Notes (Signed)
Pharmacy Antibiotic Note  Felicia Ponce is a 61 y.o. female admitted on 09/20/2017 for lumbar surgery.  Pharmacy has been consulted for Vancomycin dosing x 1 post-op for surgical prophylaxis.     Vanc 1gm IV given pre-op at 0813.  No drain.  Plan:  Vancomycin 1gm IV x 1 at 8pm tonight.  No pharmacy follow-up needed.  Height: 5\' 6"  (167.6 cm) Weight: 207 lb 0.2 oz (93.9 kg) IBW/kg (Calculated) : 59.3  Temp (24hrs), Avg:98.3 F (36.8 C), Min:97.7 F (36.5 C), Max:99.1 F (37.3 C)  Recent Labs  Lab 09/14/17 1016  WBC 13.1*  CREATININE 1.10*    Estimated Creatinine Clearance: 60.4 mL/min (A) (by C-G formula based on SCr of 1.1 mg/dL (H)).    Allergies  Allergen Reactions  . Penicillins Rash and Other (See Comments)    PATIENT HAS HAD A PCN REACTION WITH IMMEDIATE RASH, FACIAL/TONGUE/THROAT SWELLING, SOB, OR LIGHTHEADEDNESS WITH HYPOTENSION:  #  #  #  YES  #  #  #   HAS PT DEVELOPED SEVERE RASH INVOLVING MUCUS MEMBRANES or SKIN NECROSIS: #  #  #  YES  #  #  #   Has patient had a PCN reaction that required hospitalization: No Has patient had a PCN reaction occurring within the last 10 years: No    . Ciprofloxacin Nausea Only, Rash and Other (See Comments)    DIZZINESS  . Milnacipran Anxiety, Rash and Other (See Comments)    DIZZINESS WEAKNESS  . Nsaids Rash and Other (See Comments)    ULCERS   . Lactase     UNSPECIFIED REACTION   . Other     Chlorine water Acidy Food causes GI upset NO Soy, wheat, gluten, corn products  . Savella [Milnacipran Hcl] Other (See Comments)    UNSPECIFIED REACTION   . Aspirin Rash  . Latex Rash  . Pollen Extract Other (See Comments)    Stuffy nose  . Sulfa Antibiotics Rash  . Sulfamethoxazole-Trimethoprim Rash  . Sulfasalazine Rash  . Tape Rash    Can use paper tape  . Tolmetin Rash    Thank you for allowing pharmacy to be a part of this patient's care.  Dennie Fettersgan, Ambert Virrueta Donovan, [place] Pager: [phone] 09/20/2017 3:34 PM

## 2017-09-20 NOTE — H&P (Signed)
Felicia Ponce is an 61 y.o. female.   Chief Complaint: Back and leg pain HPI: 61 year old female with back and bilateral lower extremity symptoms failing conservative management.  Workup demonstrates evidence of an unstable grade 1 L4-L5 degenerative spondylolisthesis with marked foraminal stenosis.  Patient presents now for decompression and fusion at L4-5 in hopes of improving her symptoms.  Past Medical History:  Diagnosis Date  . Adrenal insufficiency (HCC)   . Arthritis   . Depression   . Fibromyalgia   . GERD (gastroesophageal reflux disease)   . History of hiatal hernia   . Hypertension   . Hyperthyroidism   . Hypoglycemia   . Myocardial infarction (HCC)   . Pneumonia   . Sleep apnea   . Spondylolisthesis of lumbar region   . TIA (transient ischemic attack)     Past Surgical History:  Procedure Laterality Date  . ABDOMINAL HYSTERECTOMY    . APPENDECTOMY    . CARDIAC CATHETERIZATION    . CHOLECYSTECTOMY    . COLONOSCOPY    . DILATION AND CURETTAGE OF UTERUS    . OOPHORECTOMY     Bilateral  . OVARIAN CYST REMOVAL    . TONSILLECTOMY      History reviewed. No pertinent family history. Social History:  reports that  has never smoked. she has never used smokeless tobacco. She reports that she does not drink alcohol or use drugs.  Allergies:  Allergies  Allergen Reactions  . Penicillins Rash and Other (See Comments)    PATIENT HAS HAD A PCN REACTION WITH IMMEDIATE RASH, FACIAL/TONGUE/THROAT SWELLING, SOB, OR LIGHTHEADEDNESS WITH HYPOTENSION:  #  #  #  YES  #  #  #   HAS PT DEVELOPED SEVERE RASH INVOLVING MUCUS MEMBRANES or SKIN NECROSIS: #  #  #  YES  #  #  #   Has patient had a PCN reaction that required hospitalization: No Has patient had a PCN reaction occurring within the last 10 years: No    . Ciprofloxacin Nausea Only, Rash and Other (See Comments)    DIZZINESS  . Milnacipran Anxiety, Rash and Other (See Comments)    DIZZINESS WEAKNESS  . Nsaids Rash  and Other (See Comments)    ULCERS   . Lactase     UNSPECIFIED REACTION   . Other     Chlorine water Acidy Food causes GI upset NO Soy, wheat, gluten, corn products  . Savella [Milnacipran Hcl] Other (See Comments)    UNSPECIFIED REACTION   . Aspirin Rash  . Latex Rash  . Pollen Extract Other (See Comments)    Stuffy nose  . Sulfa Antibiotics Rash  . Sulfamethoxazole-Trimethoprim Rash  . Sulfasalazine Rash  . Tape Rash    Can use paper tape  . Tolmetin Rash    Medications Prior to Admission  Medication Sig Dispense Refill  . ASHWAGANDHA PO Take 300 mg by mouth daily.    Marland Kitchen. dexamethasone (DECADRON) 0.75 MG tablet Take 0.75 mg by mouth daily.    Marland Kitchen. doxepin (SINEQUAN) 25 MG capsule take 1 -2 tablet at bedtime  1  . escitalopram (LEXAPRO) 20 MG tablet Take 10 mg by mouth daily.    Marland Kitchen. gabapentin (NEURONTIN) 300 MG capsule Take 300 mg by mouth at bedtime.  4  . HYDROcodone-acetaminophen (NORCO/VICODIN) 5-325 MG tablet Take 0.5-1 tablets by mouth 2 (two) times daily as needed for moderate pain (depends on pain if takes 0.5-1 tablets).   0  . Melatonin 10 MG TABS Take  10 mg by mouth at bedtime.    . methimazole (TAPAZOLE) 10 MG tablet TAKE  1/2  TABLET BY MOUTH SIX DAYS A WEEK TO CONTROL THYROID SKIPS SUNDAYS  1  . metoprolol succinate (TOPROL-XL) 25 MG 24 hr tablet Take 50 mg by mouth every evening. 2 tablets  1  . Multiple Minerals-Vitamins (CALCIUM-MAGNESIUM-ZINC-D3 PO) Take 1 tablet by mouth daily.    . Omega-3 Fatty Acids (FISH OIL) 1000 MG CAPS Take 1,000 mg by mouth daily.    . orphenadrine (NORFLEX) 100 MG tablet Take 100 mg by mouth 2 (two) times daily.    . pantoprazole (PROTONIX) 40 MG tablet Take 40 mg by mouth 2 (two) times daily.    Bertram Gala. Polyethyl Glycol-Propyl Glycol (SYSTANE) 0.4-0.3 % GEL ophthalmic gel Place 1 application into both eyes at bedtime.    . RESTASIS 0.05 % ophthalmic emulsion Place 1 drop into both eyes 2 (two) times daily.  11  . UNABLE TO FIND Take 16.6 mg  by mouth every evening. CBD Oil      No results found for this or any previous visit (from the past 48 hour(s)). No results found.    Blood pressure (!) 147/71, pulse 73, temperature 98.1 F (36.7 C), temperature source Oral, resp. rate 18, height 5\' 6"  (1.676 m), weight 90.3 kg (199 lb), SpO2 96 %.  Patient is awake and alert.  She is oriented and appropriate.  Cranial nerve function is intact.  Speech is fluent.  Judgment and insight are intact.  Motor examination reveals some mild weakness of her extensor hallucis longus on the right otherwise motor strength intact.  Sensory examination with decreased sensation to light touch in her right L4 dermatome.  Deep tendon reflexes hypoactive in both lower extremities.  No evidence of long track signs.  Gait is somewhat antalgic with a flexed posture.  Examination head ears eyes nose throat is unremarkable her chest and abdomen are benign.  Extremities are free from injury deformity. Assessment/Plan L4-L5 unstable grade 1 degenerative spondylolisthesis.  Plan L4-5 posterior lumbar interbody fusion utilizing cages, locally harvested autograft, and augmented with posterior lateral arthrodesis utilizing nonsegmental pedicle screw fixation and local autograft.  Risks and benefits of been explained.  Patient wishes to proceed.  Sherilyn CooterHenry A Derrisha Foos 09/20/2017, 7:45 AM

## 2017-09-20 NOTE — Transfer of Care (Signed)
Immediate Anesthesia Transfer of Care Note  Patient: Felicia DanaMary Susan Group  Procedure(s) Performed: Posterior Lumbar Four-Five Interbody and Fusion (N/A Back)  Patient Location: PACU  Anesthesia Type:General  Level of Consciousness: awake, alert , oriented and patient cooperative  Airway & Oxygen Therapy: Patient Spontanous Breathing and Patient connected to nasal cannula oxygen  Post-op Assessment: Report given to RN, Post -op Vital signs reviewed and stable and Patient moving all extremities X 4  Post vital signs: Reviewed and stable  Last Vitals:  Vitals:   09/20/17 0630 09/20/17 1020  BP: (!) 147/71   Pulse: 73   Resp: 18   Temp: 36.7 C (P) 36.8 C  SpO2: 96%     Last Pain:  Vitals:   09/20/17 0641  TempSrc:   PainSc: 3       Patients Stated Pain Goal: 3 (09/20/17 0641)  Complications: No apparent anesthesia complications

## 2017-09-20 NOTE — Progress Notes (Signed)
Orthopedic Tech Progress Note Patient Details:  Lavonia DanaMary Susan Murty [birth year] [id number] Patient has own brace. Patient ID: Lavonia DanaMary Susan Coudriet, female   DOB: [birth year], 61 y.o.   MRN: [id number]   Jennye MoccasinHughes, Louellen Haldeman Craig 09/20/2017, 4:24 PM

## 2017-09-20 NOTE — Op Note (Signed)
Date of procedure: 09/20/2017  Date of dictation: Same  Service: Neurosurgery  Preoperative diagnosis: Grade 1 L4-5 unstable degenerative spondylolisthesis with stenosis  Postoperative diagnosis: Same  Procedure Name: Bilateral L4-5 decompressive laminotomies with bilateral L4 and L5 decompressive foraminotomies, more than would be required for simple interbody fusion alone.  L4-5 posterior lumbar interbody fusion utilizing interbody cages, locally harvested autograft,  L4-5 posterior lateral arthrodesis utilizing nonsegmental pedicle screw fixation local autograft and morselized allograft   Surgeon:Keyonni Percival A.Francessca Friis, M.D.  Asst. Surgeon: Yetta BarreJones  Anesthesia: General  Indication: 61 year old female with progressive back and bilateral lower extremity symptoms failing conservative management her workup demonstrates evidence of an unstable grade 1 L4-5 degenerative spondylolisthesis with significant foraminal and lateral recess stenosis.  Patient presents now for decompressive surgery in hopes of improving her symptoms.  Operative note: After induction of anesthesia, patient position prone on the Wilson frame and properly padded.  Lumbar region prepped and draped sterilely.  Incision made overlying L4-5.  Dissection performed bilaterally.  Retractor placed.  Fluoroscopy used.  Levels confirmed.  Decompressive laminotomies performed bilaterally using Leksell rongeurs Kerrison Rogers and a high-speed drill to remove the inferior two thirds lamina of L4 the entire pars interarticularis and inferior facet of L4 bilaterally.  The superior articulating facet of L5 was mostly resected and the superior aspect of the L5 lamina was resected.  Ligament flavum elevated and resected.  Foraminotomies completed on the course exiting L4 and L5 nerve roots bilaterally.  Epidural venous plexus coagulated and cut.  Bilateral discectomies performed.  The space then prepared for interbody fusion.  Distractor placed in  patient's right side.  Disc space cleaned of all soft tissue on the left side.  A 9 mm [employer] expandable cage packed with locally harvested autograft was impacted into place and expanded to its full extent.  Preparations were then made for interbody fusion on the right.  Disc space was cleaned and scraped of all soft tissue.  Morselized autograft was packed into the interspace.  A second cage packed with autograft was then packed into place and expanded to its full extent.  Pedicles of L4 and L5 were identified using surface landmarks and intraoperative fluoroscopy.  Superficial bone overlying the pedicle was then removed using high-speed drill.  Each pedicle was then probed using pedicle all each pedicle tract was probed and found to be solid within the bone.  5.75 mm radius bran screws from [employer] were placed bilaterally at L4 and L5.  Final images reveal good position of the screws and the cages at the proper level with normal alignment of spine.  Wounds and irrigated one final time.  Gelfoam was placed topically for hemostasis.  Morselized autograft and magna fuse packets were packed posterior laterally along the decorticated transverse processes of L4 and L5.  Short segment titanium rods and placed over the screw heads at L4 and L5.  Locking caps placed over the screw heads.  Locking caps and engaged with a construct under compression.  Wound was then irrigated one final time.  Vancomycin powder was placed in deep wound space.  Wounds and closed in layers with Vicryl sutures.  Steri-Strips and sterile dressing were applied.  No apparent complications.  Patient tolerated the procedure well and she returns to the recovery room postop.

## 2017-09-20 NOTE — Progress Notes (Signed)
Orthopedic Tech Progress Note Patient Details:  [name] 14-Mar-1956 [id number] Called [employer] for brace order. Patient ID: [name], female   DOB: 14-Mar-1956, 61 y.o.   MRN: [id number]   Jennye MoccasinHughes, Saliha Salts Craig 09/20/2017, 3:41 PM

## 2017-09-20 NOTE — Anesthesia Preprocedure Evaluation (Addendum)
Anesthesia Evaluation  Patient identified by MRN, date of birth, ID band Patient awake    Reviewed: Allergy & Precautions, NPO status , Patient's Chart, lab work & pertinent test results, reviewed documented beta blocker date and time   Airway Mallampati: III  TM Distance: >3 FB Neck ROM: Full    Dental no notable dental hx. (+) Teeth Intact, Dental Advisory Given   Pulmonary neg pulmonary ROS, sleep apnea ,    Pulmonary exam normal breath sounds clear to auscultation       Cardiovascular hypertension, Pt. on home beta blockers and Pt. on medications + CAD  Normal cardiovascular exam Rhythm:Regular Rate:Normal  ECG: NSR, rate 72  Nuclear stress test 05/27/17 (care everywhere):  Exam within normal limits.  Echo 04/22/17 ([facility]):  1. Normal LV size and systolic function. LVH. Diastolic dysfunction. 2. Mild mitral regurgitation. 3. Moderate tricuspid regurgitation. 4. Mild to moderate pulmonary hypertension.  Cardiac cath 01/16/13 (care everywhere):  Mild non-obstructive ASCAD (mid RCA 10%) Ejection Fraction: 60% Wall Motion: Normal      Neuro/Psych PSYCHIATRIC DISORDERS Depression TIA   GI/Hepatic Neg liver ROS, hiatal hernia, GERD  Medicated and Controlled,  Endo/Other  Hyperthyroidism   Renal/GU negative Renal ROS     Musculoskeletal  (+) Arthritis , Osteoarthritis,  Fibromyalgia -Spondylolisthesis   Abdominal (+) + obese,   Peds  Hematology negative hematology ROS (+)   Anesthesia Other Findings Adrenal insufficiency Endocrinologist is Paul Gulley, MD who cleared pt for surgery noting "in holding area before surgery she should be given 50 mg hydrocortisone IV and then 25 mg every 8 hours for 24 hours. On day following surgery if she is taking oral meds she can just resume her usual replacement steroids. If she remains acutely ill for example persistent nausea or  fever, then continue 25 mg hydrocortisone IV twice daily until her acute illness resolves and resume her baseline replacement"  Reproductive/Obstetrics                             Anesthesia Physical  Anesthesia Plan  ASA: III  Anesthesia Plan: General   Post-op Pain Management:    Induction: Intravenous  PONV Risk Score and Plan: 3 and Midazolam, Dexamethasone and Ondansetron  Airway Management Planned: Oral ETT  Additional Equipment:   Intra-op Plan:   Post-operative Plan: Extubation in OR  Informed Consent: I have reviewed the patients History and Physical, chart, labs and discussed the procedure including the risks, benefits and alternatives for the proposed anesthesia with the patient or authorized representative who has indicated his/her understanding and acceptance.   Dental advisory given  Plan Discussed with: CRNA, Anesthesiologist and Surgeon  Anesthesia Plan Comments: (  )       Anesthesia Quick Evaluation  

## 2017-09-20 NOTE — Anesthesia Postprocedure Evaluation (Signed)
Anesthesia Post Note  Patient: Felicia Ponce  Procedure(s) Performed: Posterior Lumbar Four-Five Interbody and Fusion (N/A Back)     Patient location during evaluation: PACU Anesthesia Type: General Level of consciousness: awake and alert Pain management: pain level controlled Vital Signs Assessment: post-procedure vital signs reviewed and stable Respiratory status: spontaneous breathing, nonlabored ventilation, respiratory function stable and patient connected to nasal cannula oxygen Cardiovascular status: blood pressure returned to baseline and stable Postop Assessment: no apparent nausea or vomiting Anesthetic complications: no    Last Vitals:  Vitals:   09/20/17 1400 09/20/17 1415  BP:    Pulse: 100 98  Resp: (!) 22 (!) 24  Temp:    SpO2: 90% 90%    Last Pain:  Vitals:   09/20/17 1350  TempSrc:   PainSc: 4                  Ryan P Ellender

## 2017-09-20 NOTE — Brief Op Note (Signed)
09/20/2017  10:04 AM  PATIENT:  Felicia Ponce  61 y.o. female  PRE-OPERATIVE DIAGNOSIS:  Spondylolisthesis  POST-OPERATIVE DIAGNOSIS:  Spondylolisthesis  PROCEDURE:  Procedure(s): Posterior Lumbar Four-Five Interbody and Fusion (N/A)  SURGEON:  Surgeon(s) and Role:    * Julio SicksPool, Les Longmore, MD - Primary    * Tia AlertJones, David S, MD - Assisting  PHYSICIAN ASSISTANT:   ASSISTANTS:    ANESTHESIA:   general  EBL:  100 mL   BLOOD ADMINISTERED:none  DRAINS: none   LOCAL MEDICATIONS USED:  MARCAINE     SPECIMEN:  No Specimen  DISPOSITION OF SPECIMEN:  N/A  COUNTS:  YES  TOURNIQUET:  * No tourniquets in log *  DICTATION: .Dragon Dictation  PLAN OF CARE: Admit to inpatient   PATIENT DISPOSITION:  PACU - hemodynamically stable.   Delay start of Pharmacological VTE agent (>24hrs) due to surgical blood loss or risk of bleeding: yes

## 2017-09-21 ENCOUNTER — Other Ambulatory Visit: Payer: Self-pay

## 2017-09-21 MED ORDER — HYDROMORPHONE HCL 2 MG PO TABS
2.0000 mg | ORAL_TABLET | ORAL | Status: DC | PRN
  Administered 2017-09-21 – 2017-09-22 (×3): 4 mg via ORAL
  Filled 2017-09-21 (×3): qty 2

## 2017-09-21 NOTE — Evaluation (Signed)
Physical Therapy Evaluation Patient Details Name: Felicia Ponce MRN: [id number] DOB: 1956-04-01 Today's Date: 09/21/2017   History of Present Illness  patient is a 61 yo female s/p Posterior Lumbar Four-Five Interbody and Fusion   Clinical Impression  Patient seen for mobility assessment s/p spinal surgery  Educated patient on precautions, mobility expectations, safety and car transfers. Patient demonstrates deficits in functional mobility as indicated below. Will benefit from continued skilled PT to address deficits and maximize function. Will see as indicated and progress as tolerated.      Follow Up Recommendations No PT follow up;Supervision/Assistance - 24 hour    Equipment Recommendations  Rolling walker with 5" wheels;3in1 (PT)    Recommendations for Other Services       Precautions / Restrictions Precautions Precautions: Back Precaution Comments: verbally reviewed with patient Required Braces or Orthoses: Spinal Brace Spinal Brace: Lumbar corset      Mobility  Bed Mobility Overal bed mobility: Needs Assistance Bed Mobility: Rolling;Sidelying to Sit;Sit to Sidelying Rolling: Supervision Sidelying to sit: Min assist     Sit to sidelying: Min assist General bed mobility comments: min assist to elevate trunk to upright, min assist to elevate LEs back to bed   Transfers Overall transfer level: Needs assistance Equipment used: Rolling walker (2 wheeled) Transfers: Sit to/from Stand Sit to Stand: Min guard         General transfer comment: min guard for safety, VCs for hand placement  Ambulation/Gait Ambulation/Gait assistance: Min guard Ambulation Distance (Feet): 210 Feet Assistive device: Rolling walker (2 wheeled);1 person hand held assist Gait Pattern/deviations: Step-through pattern;Decreased stride length Gait velocity: decreased Gait velocity interpretation: Below normal speed for age/gender General Gait Details: VCs for posture and positioning,  cues for increased gait speed  Stairs            Wheelchair Mobility    Modified Rankin (Stroke Patients Only)       Balance Overall balance assessment: Needs assistance   Sitting balance-Leahy Scale: Fair       Standing balance-Leahy Scale: Fair Standing balance comment: able to stand without assist                             Pertinent Vitals/Pain Pain Assessment: Faces Pain Score: 6  Faces Pain Scale: Hurts even more Pain Location: back and hip Pain Descriptors / Indicators: Aching;Operative site guarding Pain Intervention(s): Monitored during session;Repositioned    Home Living Family/patient expects to be discharged to:: Private residence Living Arrangements: Spouse/significant other Available Help at Discharge: Family Type of Home: Mobile home Home Access: Stairs to enter   Secretary/administratorntrance Stairs-Number of Steps: 2 Home Layout: One level        Prior Function Level of Independence: Independent               Hand Dominance   Dominant Hand: Right    Extremity/Trunk Assessment        Lower Extremity Assessment Lower Extremity Assessment: Generalized weakness    Cervical / Trunk Assessment Cervical / Trunk Assessment: (s/p spinal surgery)  Communication   Communication: No difficulties  Cognition Arousal/Alertness: Awake/alert Behavior During Therapy: WFL for tasks assessed/performed Overall Cognitive Status: Within Functional Limits for tasks assessed                                        General Comments  Exercises     Assessment/Plan    PT Assessment Patient needs continued PT services  PT Problem List Decreased strength;Decreased activity tolerance;Decreased balance;Decreased mobility;Decreased safety awareness;Pain;Decreased knowledge of precautions       PT Treatment Interventions DME instruction;Gait training;Functional mobility training;Therapeutic activities;Therapeutic exercise;Balance  training;Stair training;Neuromuscular re-education;Patient/family education    PT Goals (Current goals can be found in the Care Plan section)  Acute Rehab PT Goals Patient Stated Goal: to go home PT Goal Formulation: With patient Time For Goal Achievement: 10/05/17 Potential to Achieve Goals: Good    Frequency Min 5X/week   Barriers to discharge        Co-evaluation               AM-PAC PT "6 Clicks" Daily Activity  Outcome Measure Difficulty turning over in bed (including adjusting bedclothes, sheets and blankets)?: A Little Difficulty moving from lying on back to sitting on the side of the bed? : Unable Difficulty sitting down on and standing up from a chair with arms (e.g., wheelchair, bedside commode, etc,.)?: A Little Help needed moving to and from a bed to chair (including a wheelchair)?: A Little Help needed walking in hospital room?: A Little Help needed climbing 3-5 steps with a railing? : A Lot 6 Click Score: 15    End of Session Equipment Utilized During Treatment: Gait belt;Back brace Activity Tolerance: Patient tolerated treatment well Patient left: in bed;with call bell/phone within reach;with family/visitor present;with SCD's reapplied Nurse Communication: Mobility status PT Visit Diagnosis: Difficulty in walking, not elsewhere classified (R26.2)    Time: 6578-46961030-1052 PT Time Calculation (min) (ACUTE ONLY): 22 min   Charges:   PT Evaluation $PT Eval Moderate Complexity: 1 Mod     PT G Codes:        Felicia Ponce, PT DPT  Board Certified Neurologic Specialist [phone]   Felicia Ponce 09/21/2017, 11:18 AM

## 2017-09-21 NOTE — Progress Notes (Signed)
Postop day 1.  Overall doing reasonably well.  Preoperative back and lower extremity pain much improved.  Walking with therapy.  Afebrile.  Vitals are stable.  Awake and alert.  Oriented and appropriate.  Motor and sensory function intact.  Progressing well.  Continue efforts at mobilization.  Probable discharge home tomorrow.

## 2017-09-21 NOTE — Evaluation (Signed)
Occupational Therapy Evaluation Patient Details Name: Felicia Ponce MRN: [id number] DOB: [birth year] Today's Date: 09/21/2017    History of Present Illness 61 yo female s/p Posterior Lumbar Four-Five Interbody and Fusion    Clinical Impression   PTA, pt was living with her husband and was independent. Pt currently requiring Min A for brace management, Mod A for LB ADLs,and Min Guard for functional mobility. Educated pt on LB dressing with reacher and pt donning underwear with Min guard for safety. Pt will require further acute OT to address LB ADLs, toileting, and functional transfers. Recommend dc home once medically stable per physician.     Follow Up Recommendations  No OT follow up;Supervision/Assistance - 24 hour    Equipment Recommendations  3 in 1 bedside commode    Recommendations for Other Services PT consult     Precautions / Restrictions Precautions Precautions: Back Precaution Booklet Issued: Yes (comment) Precaution Comments: verbally reviewed with patient Required Braces or Orthoses: Spinal Brace Spinal Brace: Lumbar corset;Applied in sitting position Restrictions Weight Bearing Restrictions: No      Mobility Bed Mobility Overal bed mobility: Needs Assistance Bed Mobility: Rolling;Sidelying to Sit;Sit to Sidelying Rolling: Supervision Sidelying to sit: Min guard     Sit to sidelying: Min assist General bed mobility comments: min assist to elevate LEs back to bed   Transfers Overall transfer level: Needs assistance Equipment used: None Transfers: Sit to/from Stand Sit to Stand: Min guard         General transfer comment: min guard for safety, VCs for hand placement    Balance Overall balance assessment: Needs assistance Sitting-balance support: No upper extremity supported;Feet supported Sitting balance-Leahy Scale: Fair     Standing balance support: No upper extremity supported;During functional activity Standing balance-Leahy Scale:  Fair Standing balance comment: able to stand without assist                           ADL either performed or assessed with clinical judgement   ADL Overall ADL's : Needs assistance/impaired Eating/Feeding: Set up;Supervision/ safety;Sitting   Grooming: Wash/dry hands;Supervision/safety;Standing   Upper Body Bathing: Set up;Supervision/ safety;Sitting   Lower Body Bathing: Moderate assistance;Sit to/from stand;Adhering to back precautions Lower Body Bathing Details (indicate cue type and reason): Pt required Mod A for LB ADLs to adhere to back precautions. educated pt on long handled sponge and where to purchase them Upper Body Dressing : Minimal assistance;Sitting Upper Body Dressing Details (indicate cue type and reason): Min A to manage back brace Lower Body Dressing: Moderate assistance;Sit to/from stand;Adhering to back precautions;With adaptive equipment;Min guard Lower Body Dressing Details (indicate cue type and reason): Pt able to bring ankle to knee with physical A. Educated pt on comensatory techniques for LB clothing. Pt donned underwear with reacher and Min guard for safety.  Toilet Transfer: Min guard;Ambulation;BSC     Toileting - Clothing Manipulation Details (indicate cue type and reason): Pt would benefit form education on toilet aide; provided dmeonstration of toilet aide use. Pt able to perform peri care but limited by pain   Tub/Shower Transfer Details (indicate cue type and reason): Will need education on safe shower transfer Functional mobility during ADLs: Min guard General ADL Comments: Pt demonstrating good progress post surgery. Pt required assistance for LB ADLs and toileting. Husband present and very receptive of educaitonal information     Vision Baseline Vision/History: Wears glasses Wears Glasses: At all times Patient Visual Report: No change from baseline  Perception     Praxis      Pertinent Vitals/Pain Pain Assessment:  Faces Faces Pain Scale: Hurts whole lot Pain Location: back and hip Pain Descriptors / Indicators: Aching;Operative site guarding Pain Intervention(s): Monitored during session;Limited activity within patient's tolerance;Repositioned;Premedicated before session     Hand Dominance Right   Extremity/Trunk Assessment Upper Extremity Assessment Upper Extremity Assessment: Overall WFL for tasks assessed   Lower Extremity Assessment Lower Extremity Assessment: Defer to PT evaluation   Cervical / Trunk Assessment Cervical / Trunk Assessment: Other exceptions(s/p spinal surgery)   Communication Communication Communication: No difficulties   Cognition Arousal/Alertness: Awake/alert Behavior During Therapy: WFL for tasks assessed/performed Overall Cognitive Status: Within Functional Limits for tasks assessed                                     General Comments  Husband present    Exercises     Shoulder Instructions      Home Living Family/patient expects to be discharged to:: Private residence Living Arrangements: Spouse/significant other Available Help at Discharge: Family Type of Home: Mobile home Home Access: Stairs to enter Secretary/administrator of Steps: 2   Home Layout: One level     Bathroom Shower/Tub: Producer, television/film/video: Standard     Home Equipment: None          Prior Functioning/Environment Level of Independence: Independent                 OT Problem List: Decreased range of motion;Decreased activity tolerance;Decreased knowledge of use of DME or AE;Decreased knowledge of precautions;Pain      OT Treatment/Interventions: Therapeutic exercise;Self-care/ADL training;Energy conservation;DME and/or AE instruction;Therapeutic activities;Patient/family education    OT Goals(Current goals can be found in the care plan section) Acute Rehab OT Goals Patient Stated Goal: to go home OT Goal Formulation: With patient Time  For Goal Achievement: 10/05/17 Potential to Achieve Goals: Good ADL Goals Pt Will Perform Lower Body Dressing: with set-up;with supervision;sit to/from stand;with adaptive equipment Pt Will Perform Toileting - Clothing Manipulation and hygiene: with set-up;with adaptive equipment;with supervision;sit to/from stand Pt Will Perform Tub/Shower Transfer: Shower transfer;3 in 1;with supervision;ambulating Additional ADL Goal #1: Pt will independently verbalize 3/3 back precautions  OT Frequency: Min 2X/week   Barriers to D/C:            Co-evaluation              AM-PAC PT "6 Clicks" Daily Activity     Outcome Measure Help from another person eating meals?: None Help from another person taking care of personal grooming?: None Help from another person toileting, which includes using toliet, bedpan, or urinal?: A Little Help from another person bathing (including washing, rinsing, drying)?: A Little Help from another person to put on and taking off regular upper body clothing?: A Little Help from another person to put on and taking off regular lower body clothing?: A Little 6 Click Score: 20   End of Session Equipment Utilized During Treatment: Gait belt;Back brace Nurse Communication: Mobility status;Precautions  Activity Tolerance: Patient tolerated treatment well;Patient limited by pain Patient left: in bed;with call bell/phone within reach;with family/visitor present  OT Visit Diagnosis: Unsteadiness on feet (R26.81);Other abnormalities of gait and mobility (R26.89);Muscle weakness (generalized) (M62.81);Other symptoms and signs involving cognitive function;Pain Pain - Right/Left: (Back) Pain - part of body: (Back)  Time: [id number] OT Time Calculation (min): 31 min Charges:  OT General Charges $OT Visit: 1 Visit OT Evaluation $OT Eval Moderate Complexity: 1 Mod OT Treatments $Self Care/Home Management : 8-22 mins G-Codes:     Felicia Ponce MSOT,  OTR/L Acute Rehab Pager: [phone] Office: [phone]  Felicia Ponce 09/21/2017, 3:46 PM

## 2017-09-22 MED ORDER — METHYLPREDNISOLONE 4 MG PO TBPK: ORAL_TABLET | ORAL | 0 refills | Status: DC

## 2017-09-22 MED ORDER — DIAZEPAM 5 MG PO TABS: 5.0000 mg | ORAL_TABLET | Freq: Four times a day (QID) | ORAL | 0 refills | Status: DC | PRN

## 2017-09-22 MED ORDER — HYDROMORPHONE HCL 2 MG PO TABS: 2.0000 mg | ORAL_TABLET | ORAL | 0 refills | Status: DC | PRN

## 2017-09-22 NOTE — Discharge Summary (Signed)
Physician Discharge Summary  Patient ID: Felicia DanaMary Susan Gianfrancesco MRN: [id number] DOB/AGE: July 29, 1956 61 y.o.  Admit date: 09/20/2017 Discharge date: 09/22/2017  Admission Diagnoses:  Discharge Diagnoses:  Active Problems:   Degenerative spondylolisthesis   Discharged Condition: good  Hospital Course: Patient admitted to hospital where she underwent uncomplicated L4-5 decompression and fusion.  Postoperatively she is done well.  Preoperative back and radicular pain are improved.  Patient is ambulating with minimal assistance.  No symptoms of weakness or sensory loss.  Bowel and bladder function intact.  Ready for discharge home.  Consults:   Significant Diagnostic Studies:   Treatments:   Discharge Exam: Blood pressure 108/65, pulse 64, temperature 99 F (37.2 C), temperature source Oral, resp. rate 18, height 5\' 6"  (1.676 m), weight 93.9 kg (207 lb 0.2 oz), SpO2 97 %. Awake and alert.  Oriented and appropriate.  Cranial nerve function intact.  Motor and sensory function extremities normal.  Wound clean and dry.  Chest and abdomen benign.  Disposition: Final discharge disposition not confirmed   Allergies as of 09/22/2017      Reactions   Penicillins Rash, Other (See Comments)   PATIENT HAS HAD A PCN REACTION WITH IMMEDIATE RASH, FACIAL/TONGUE/THROAT SWELLING, SOB, OR LIGHTHEADEDNESS WITH HYPOTENSION:  #  #  #  YES  #  #  #   HAS PT DEVELOPED SEVERE RASH INVOLVING MUCUS MEMBRANES or SKIN NECROSIS: #  #  #  YES  #  #  #   Has patient had a PCN reaction that required hospitalization: No Has patient had a PCN reaction occurring within the last 10 years: No   Ciprofloxacin Nausea Only, Rash, Other (See Comments)   DIZZINESS   Milnacipran Anxiety, Rash, Other (See Comments)   DIZZINESS WEAKNESS   Nsaids Rash, Other (See Comments)   ULCERS   Lactase    UNSPECIFIED REACTION    Other    Chlorine water Acidy Food causes GI upset NO Soy, wheat, gluten, corn products   Savella  [milnacipran Hcl] Other (See Comments)   UNSPECIFIED REACTION    Aspirin Rash   Latex Rash   Pollen Extract Other (See Comments)   Stuffy nose   Sulfa Antibiotics Rash   Sulfamethoxazole-trimethoprim Rash   Sulfasalazine Rash   Tape Rash   Can use paper tape   Tolmetin Rash      Medication List    TAKE these medications   ASHWAGANDHA PO Take 300 mg by mouth daily.   CALCIUM-MAGNESIUM-ZINC-D3 PO Take 1 tablet by mouth daily.   dexamethasone 0.75 MG tablet Commonly known as:  DECADRON Take 0.75 mg by mouth daily.   diazepam 5 MG tablet Commonly known as:  VALIUM Take 1-2 tablets (5-10 mg total) by mouth every 6 (six) hours as needed for muscle spasms.   doxepin 25 MG capsule Commonly known as:  SINEQUAN take 1 -2 tablet at bedtime   escitalopram 20 MG tablet Commonly known as:  LEXAPRO Take 10 mg by mouth daily.   Fish Oil 1000 MG Caps Take 1,000 mg by mouth daily.   gabapentin 300 MG capsule Commonly known as:  NEURONTIN Take 300 mg by mouth at bedtime.   HYDROcodone-acetaminophen 5-325 MG tablet Commonly known as:  NORCO/VICODIN Take 0.5-1 tablets by mouth 2 (two) times daily as needed for moderate pain (depends on pain if takes 0.5-1 tablets).   HYDROmorphone 2 MG tablet Commonly known as:  DILAUDID Take 1-2 tablets (2-4 mg total) by mouth every 4 (four) hours as needed  for severe pain.   Melatonin 10 MG Tabs Take 10 mg by mouth at bedtime.   methimazole 10 MG tablet Commonly known as:  TAPAZOLE TAKE  1/2  TABLET BY MOUTH SIX DAYS A WEEK TO CONTROL THYROID SKIPS SUNDAYS   methylPREDNISolone 4 MG Tbpk tablet Commonly known as:  MEDROL DOSEPAK follow package directions   metoprolol succinate 25 MG 24 hr tablet Commonly known as:  TOPROL-XL Take 50 mg by mouth every evening. 2 tablets   orphenadrine 100 MG tablet Commonly known as:  NORFLEX Take 100 mg by mouth 2 (two) times daily.   pantoprazole 40 MG tablet Commonly known as:  PROTONIX Take  40 mg by mouth 2 (two) times daily.   RESTASIS 0.05 % ophthalmic emulsion Generic drug:  cycloSPORINE Place 1 drop into both eyes 2 (two) times daily.   SYSTANE 0.4-0.3 % Gel ophthalmic gel Generic drug:  Polyethyl Glycol-Propyl Glycol Place 1 application into both eyes at bedtime.   UNABLE TO FIND Take 16.6 mg by mouth every evening. CBD Oil            Durable Medical Equipment  (From admission, onward)        Start     Ordered   09/20/17 1521  DME Walker rolling  Once    Question:  Patient needs a walker to treat with the following condition  Answer:  Degenerative spondylolisthesis   09/20/17 1520   09/20/17 1521  DME 3 n 1  Once     12 /10/18 1520       Signed: Kathaleen MaserHenry A Josede Cicero 09/22/2017, 8:21 AM

## 2017-09-22 NOTE — Care Management Note (Signed)
Case Management Note  Patient Details  Name: [name] MRN: [id number] Date of Birth: Jun 10, 1956  Subjective/Objective:  61 yo female s/p Posterior Lumbar Four-Five Interbody and Fusion.  PTA, pt independent, lives at home with spouse.      Action/Plan: Pt medically stable for discharge home today with spouse, who is able to provide care.  [facility].  Pt with large packet of forms for what appears to be a Long Term Care policy.  She is insistent that I fill them out, but they clearly state that they need to be completed by physician.  I have completed the section regarding DME only, as she is so insistent.  I am not willing to fill out sections regarding disability, as pt states she is already receiving disability benefits.  Pt states she will follow up with MD on these.    Expected Discharge Date:  09/22/17               Expected Discharge Plan:  Home/Self Care  In-House Referral:     Discharge planning Services  CM Consult  Post Acute Care Choice:    Choice offered to:     DME Arranged:  3-N-1, Walker rolling DME Agency:  [facility]  HH Arranged:  NA HH Agency:     Status of Service:  Completed, signed off  If discussed at [employer] Length of Stay Meetings, dates discussed:    Additional Comments:  Quintella BatonJulie W. Trae Bovenzi, RN, BSN  Trauma/Neuro ICU Case Manager [phone]

## 2017-09-22 NOTE — Progress Notes (Signed)
Occupational Therapy Treatment Patient Details Name: Felicia Ponce MRN: [id number] DOB: [birth year] Today's Date: 09/22/2017    History of present illness 61 yo female s/p Posterior Lumbar Four-Five Interbody and Fusion    OT comments  Pt progressing towards established OT goals. Pt donning back brace with set up and supervision at EOB and VCs for proper positioning. Pt performing simulated shower transfer with Min Guard A and demonstrating understanding of safe techqniue. All acute OT needs met and education provided. Will sign off. Continue to recommend dc home once medically stable per physician.    Follow Up Recommendations  No OT follow up;Supervision/Assistance - 24 hour    Equipment Recommendations  3 in 1 bedside commode    Recommendations for Other Services PT consult    Precautions / Restrictions Precautions Precautions: Back Precaution Booklet Issued: Yes (comment) Precaution Comments: verbally reviewed with patient. Pt able to recall 3/3 back precautions Required Braces or Orthoses: Spinal Brace Spinal Brace: Lumbar corset;Applied in sitting position Restrictions Weight Bearing Restrictions: No       Mobility Bed Mobility Overal bed mobility: Needs Assistance Bed Mobility: Rolling;Sidelying to Sit;Sit to Sidelying Rolling: Supervision Sidelying to sit: Min guard       General bed mobility comments: Min guard for safety. Pt using bed rails to roll  Transfers Overall transfer level: Needs assistance Equipment used: None Transfers: Sit to/from Stand Sit to Stand: Min guard         General transfer comment: min guard for safety, VCs for hand placement    Balance Overall balance assessment: Needs assistance Sitting-balance support: No upper extremity supported;Feet supported Sitting balance-Leahy Scale: Fair     Standing balance support: No upper extremity supported;During functional activity Standing balance-Leahy Scale: Fair Standing balance  comment: able to stand without assist                           ADL either performed or assessed with clinical judgement   ADL Overall ADL's : Needs assistance/impaired                 Upper Body Dressing : Set up;Supervision/safety;Sitting;Cueing for sequencing Upper Body Dressing Details (indicate cue type and reason): Set up and supervision for management of brace. Pt donning brace at EOB with VCs for positioning Lower Body Dressing: Min guard Lower Body Dressing Details (indicate cue type and reason): Pt adjusting socks at EOB by bringing ankle to knee         Tub/ Shower Transfer: Min guard;Ambulation;Walk-in shower Tub/Shower Transfer Details (indicate cue type and reason): Educating pt on safe shower transfer. Pt demonstrating understanding with Min guard A.  Functional mobility during ADLs: Min guard General ADL Comments: Educated pt on safe shower transfer and AE for home. Pt verbalizing and demonstrating dunerstanding.      Vision       Perception     Praxis      Cognition Arousal/Alertness: Awake/alert Behavior During Therapy: WFL for tasks assessed/performed Overall Cognitive Status: Within Functional Limits for tasks assessed                                          Exercises     Shoulder Instructions       General Comments Husband present at end of session    Pertinent Vitals/ Pain  Pain Assessment: Faces Faces Pain Scale: Hurts even more Pain Location: back and hip Pain Descriptors / Indicators: Aching;Operative site guarding Pain Intervention(s): Monitored during session;Limited activity within patient's tolerance;Repositioned  Home Living                                          Prior Functioning/Environment              Frequency  Min 2X/week        Progress Toward Goals  OT Goals(current goals can now be found in the care plan section)  Progress towards OT goals:  Progressing toward goals  Acute Rehab OT Goals Patient Stated Goal: to go home OT Goal Formulation: With patient Time For Goal Achievement: 10/05/17 Potential to Achieve Goals: Good ADL Goals Pt Will Perform Lower Body Dressing: with set-up;with supervision;sit to/from stand;with adaptive equipment Pt Will Perform Toileting - Clothing Manipulation and hygiene: with set-up;with adaptive equipment;with supervision;sit to/from stand Pt Will Perform Tub/Shower Transfer: Shower transfer;3 in 1;with supervision;ambulating Additional ADL Goal #1: Pt will independently verbalize 3/3 back precautions  Plan Discharge plan remains appropriate    Co-evaluation                 AM-PAC PT "6 Clicks" Daily Activity     Outcome Measure   Help from another person eating meals?: None Help from another person taking care of personal grooming?: None Help from another person toileting, which includes using toliet, bedpan, or urinal?: A Little Help from another person bathing (including washing, rinsing, drying)?: A Little Help from another person to put on and taking off regular upper body clothing?: A Little Help from another person to put on and taking off regular lower body clothing?: A Little 6 Click Score: 20    End of Session Equipment Utilized During Treatment: Back brace  OT Visit Diagnosis: Unsteadiness on feet (R26.81);Other abnormalities of gait and mobility (R26.89);Muscle weakness (generalized) (M62.81);Other symptoms and signs involving cognitive function;Pain Pain - Right/Left: (Back) Pain - part of body: (Back)   Activity Tolerance Patient tolerated treatment well;Patient limited by pain   Patient Left with call bell/phone within reach;with family/visitor present;in chair   Nurse Communication Mobility status;Precautions        Time: 5830-9407 OT Time Calculation (min): 19 min  Charges: OT General Charges $OT Visit: 1 Visit OT Treatments $Self Care/Home Management :  8-22 mins  [place], OTR/L Acute Rehab Pager: [phone] Office: [place] 09/22/2017, 10:46 AM

## 2017-09-22 NOTE — Discharge Instructions (Signed)

## 2017-09-22 NOTE — Progress Notes (Signed)
Physical Therapy Evaluation Patient Details Name: Felicia Ponce MRN: [id number] DOB: 17-Sep-1956 Today's Date: 09/22/2017   History of Present Illness  61 yo female s/p Posterior Lumbar Four-Five Interbody and Fusion   Clinical Impression  Pt progressing towards physical therapy goals. Was able to progress mobility and tolerated stair training with no complaints of increased pain. Pt and husband anticipate d/c home today - they were both educated on brace wearing schedule, car transfer, and recommended activity progression. Will continue to follow and progress as able per POC.     Follow Up Recommendations No PT follow up;Supervision/Assistance - 24 hour    Equipment Recommendations  Rolling walker with 5" wheels;3in1 (PT)    Recommendations for Other Services       Precautions / Restrictions Precautions Precautions: Back Precaution Booklet Issued: Yes (comment) Precaution Comments: verbally reviewed with patient. Pt able to recall 3/3 back precautions Required Braces or Orthoses: Spinal Brace Spinal Brace: Lumbar corset;Applied in sitting position Restrictions Weight Bearing Restrictions: No      Mobility  Bed Mobility Overal bed mobility: Needs Assistance Bed Mobility: Rolling;Sidelying to Sit;Sit to Sidelying Rolling: Supervision Sidelying to sit: Min guard       General bed mobility comments: Pt sitting up in recliner upon PT arrival. Pt was educated on proper sitting posture and use of pillows for added back support  Transfers Overall transfer level: Needs assistance Equipment used: None Transfers: Sit to/from Stand Sit to Stand: Min guard         General transfer comment: Close guard for safety, VCs for hand placement on seated surface for safety.   Ambulation/Gait Ambulation/Gait assistance: Min guard Ambulation Distance (Feet): 200 Feet Assistive device: Rolling walker (2 wheeled) Gait Pattern/deviations: Step-through pattern;Decreased stride  length Gait velocity: decreased Gait velocity interpretation: Below normal speed for age/gender General Gait Details: VC's for improved posture and general safety with RW use. No unsteadiness or LOB noted.   Stairs Stairs: Yes Stairs assistance: Min assist Stair Management: One rail Right;Step to pattern;Forwards Number of Stairs: 4(1 stair x4 trials) General stair comments: Initially with HHA on L and railing use on R. Progressed to eventual HHA on L only to simulate home environment of no rails and husband assisting. Pt was able to complete with min assist for balance support.   Wheelchair Mobility    Modified Rankin (Stroke Patients Only)       Balance Overall balance assessment: Needs assistance Sitting-balance support: No upper extremity supported;Feet supported Sitting balance-Leahy Scale: Fair     Standing balance support: No upper extremity supported;During functional activity Standing balance-Leahy Scale: Fair Standing balance comment: able to stand without assist                             Pertinent Vitals/Pain Pain Assessment: Faces Faces Pain Scale: Hurts even more Pain Location: back and hip Pain Descriptors / Indicators: Aching;Operative site guarding Pain Intervention(s): Limited activity within patient's tolerance;Monitored during session;Repositioned    Home Living                        Prior Function                 Hand Dominance        Extremity/Trunk Assessment                Communication      Cognition Arousal/Alertness: Awake/alert Behavior [facility]  tasks assessed/performed Overall Cognitive Status: Within Functional Limits for tasks assessed                                        General Comments General comments (skin integrity, edema, etc.): Husband present at end of session    Exercises     Assessment/Plan    PT Assessment    PT Problem List         PT  Treatment Interventions      PT Goals (Current goals can be found in the Care Plan section)  Acute Rehab PT Goals Patient Stated Goal: to go home PT Goal Formulation: With patient Time For Goal Achievement: 10/05/17 Potential to Achieve Goals: Good    Frequency Min 5X/week   Barriers to discharge        Co-evaluation               AM-PAC PT "6 Clicks" Daily Activity  Outcome Measure Difficulty turning over in bed (including adjusting bedclothes, sheets and blankets)?: A Little Difficulty moving from lying on back to sitting on the side of the bed? : Unable Difficulty sitting down on and standing up from a chair with arms (e.g., wheelchair, bedside commode, etc,.)?: A Little Help needed moving to and from a bed to chair (including a wheelchair)?: A Little Help needed walking in hospital room?: A Little Help needed climbing 3-5 steps with a railing? : A Lot 6 Click Score: 15    End of Session Equipment Utilized During Treatment: Gait belt;Back brace Activity Tolerance: Patient tolerated treatment well Patient left: Other (comment)(In transport chair ready for d/c) Nurse Communication: Mobility status PT Visit Diagnosis: Difficulty in walking, not elsewhere classified (R26.2)    Time: [id number] PT Time Calculation (min) (ACUTE ONLY): 20 min   Charges:     PT Treatments $Gait Training: 8-22 mins   PT G Codes:        Conni SlipperLaura Merridith Dershem, PT, DPT Acute Rehabilitation Services Pager: [phone]   Marylynn PearsonLaura D Ashlin Kreps 09/22/2017, 12:23 PM

## 2017-10-15 MED FILL — Sodium Chloride IV Soln 0.9%: INTRAVENOUS | Qty: 1000 | Status: AC

## 2017-10-15 MED FILL — Heparin Sodium (Porcine) Inj 1000 Unit/ML: INTRAMUSCULAR | Qty: 30 | Status: AC

## 2018-09-01 ENCOUNTER — Other Ambulatory Visit: Payer: Self-pay | Admitting: Neurosurgery

## 2018-09-16 NOTE — Pre-Procedure Instructions (Addendum)
Felicia Ponce  09/16/2018      [facility] [address] [place], [place] - [address] [place] [address] Phone: [phone] Fax: [phone]    Your procedure is scheduled on September 26, 2018.  Report to [facility] Admitting at 1140 AM.  Call this number if you have problems the morning of surgery:  [phone]   Remember:  Do not eat or drink after midnight.    Take these medicines the morning of surgery with A SIP OF WATER  Metoprolol succinate (Toprol XL) Oxycodone-acetaminophen (percocet)-if needed for pain) Albuterol inhaler-if needed-bring with you Dexamethasone (decadron) Diazepam (valium) Doxycycline (vibra-tabs) Escitalopram (lexapro) Esomeprazole (nexium) Orphenadrine (norflex)-if needed for spasms Promethazine (phenergan)-if needed for nausea Restasis eyedrops  7 days prior to surgery STOP taking any Aspirin (unless otherwise instructed by your surgeon), Aleve, Naproxen, Ibuprofen, Motrin, Advil, Goody's, BC's, all herbal medications, fish oil, and all vitamins    How to Manage Your Diabetes Before and After Surgery  Why is it important to control my blood sugar before and after surgery? . Improving blood sugar levels before and after surgery helps healing and can limit problems. . A way of improving blood sugar control is eating a healthy diet by: o  Eating less sugar and carbohydrates o  Increasing activity/exercise o  Talking with your doctor about reaching your blood sugar goals . High blood sugars (greater than 180 mg/dL) can raise your risk of infections and slow your recovery, so you will need to focus on controlling your diabetes during the weeks before surgery. . Make sure that the doctor who takes care of your diabetes knows about your planned surgery including the date and location.  How do I manage my blood sugar before surgery? . Check your blood sugar at least 4 times a day, starting 2 days before  surgery, to make sure that the level is not too high or low. o Check your blood sugar the morning of your surgery when you wake up and every 2 hours until you get to the Short Stay unit. . If your blood sugar is less than 70 mg/dL, you will need to treat for low blood sugar: o Do not take insulin. o Treat a low blood sugar (less than 70 mg/dL) with  cup of clear juice (cranberry or apple), 4 glucose tablets, OR glucose gel. Recheck blood sugar in 15 minutes after treatment (to make sure it is greater than 70 mg/dL). If your blood sugar is not greater than 70 mg/dL on recheck, call [phone] o  for further instructions. . Report your blood sugar to the short stay nurse when you get to Short Stay.  . If you are admitted to the hospital after surgery: o Your blood sugar will be checked by the staff and you will probably be given insulin after surgery (instead of oral diabetes medicines) to make sure you have good blood sugar levels. o The goal for blood sugar control after surgery is 80-180 mg/dL.    Do not wear jewelry, make-up or nail polish.  Do not wear lotions, powders, or perfumes, or deodorant.  Do not shave 48 hours prior to surgery.    Do not bring valuables to the hospital.  [facility] is not responsible for any belongings or valuables.  Contacts, dentures or bridgework may not be worn into surgery.  Leave your suitcase in the car.  After surgery it may be brought to your room.  For  patients admitted to the hospital, discharge time will be determined by your treatment team.  Patients discharged the day of surgery will not be allowed to drive home.    [facility]- Preparing For Surgery  Before surgery, you can play an important role. Because skin is not sterile, your skin needs to be as free of germs as possible. You can reduce the number of germs on your skin by washing with CHG (chlorahexidine gluconate) Soap before surgery.  CHG is an antiseptic cleaner which kills germs  and bonds with the skin to continue killing germs even after washing.    Oral Hygiene is also important to reduce your risk of infection.  Remember - BRUSH YOUR TEETH THE MORNING OF SURGERY WITH YOUR REGULAR TOOTHPASTE  Please do not use if you have an allergy to CHG or antibacterial soaps. If your skin becomes reddened/irritated stop using the CHG.  Do not shave (including legs and underarms) for at least 48 hours prior to first CHG shower. It is OK to shave your face.  Please follow these instructions carefully.   1. Shower the NIGHT BEFORE SURGERY and the MORNING OF SURGERY with CHG.   2. If you chose to wash your hair, wash your hair first as usual with your normal shampoo.  3. After you shampoo, rinse your hair and body thoroughly to remove the shampoo.  4. Use CHG as you would any other liquid soap. You can apply CHG directly to the skin and wash gently with a scrungie or a clean washcloth.   5. Apply the CHG Soap to your body ONLY FROM THE NECK DOWN.  Do not use on open wounds or open sores. Avoid contact with your eyes, ears, mouth and genitals (private parts). Wash Face and genitals (private parts)  with your normal soap.  6. Wash thoroughly, paying special attention to the area where your surgery will be performed.  7. Thoroughly rinse your body with warm water from the neck down.  8. DO NOT shower/wash with your normal soap after using and rinsing off the CHG Soap.  9. Pat yourself dry with a CLEAN TOWEL.  10. Wear CLEAN PAJAMAS to bed the night before surgery, wear comfortable clothes the morning of surgery  11. Place CLEAN SHEETS on your bed the night of your first shower and DO NOT SLEEP WITH PETS.  Day of Surgery:  Do not apply any deodorants/lotions.  Please wear clean clothes to the hospital/surgery center.   Remember to brush your teeth WITH YOUR REGULAR TOOTHPASTE.

## 2018-09-16 NOTE — Progress Notes (Addendum)
PCP: Burnett CorrenteAndras Neumark, MD  Cardiologist: pt denies-pt reports she had a mild heart attack in 2011 but is no longer required to see a cardiologist  EKG: 09/20/17 in Epic and within the past 6 months per pt, [facility]  Stress test: denies past 5 years  ECHO: 2012 in Care Everywhere  Cardiac Cath: pt denies  Chest x-ray: within the past 6 months per pt, [facility]  Patient has Addison's disease and has a letter from her PCP with instructions for pre-op steroids.  Called and spoke with Lestine MountJames Owen PA-C and informed him of this, he will review chart and orders

## 2018-09-19 ENCOUNTER — Encounter (HOSPITAL_COMMUNITY)
Admission: RE | Admit: 2018-09-19 | Discharge: 2018-09-19 | Disposition: A | Discharge status: Home / Self Care | Payer: Medicare HMO | Source: Ambulatory Visit | Attending: Neurosurgery | Admitting: Neurosurgery

## 2018-09-19 ENCOUNTER — Other Ambulatory Visit: Payer: Self-pay

## 2018-09-19 ENCOUNTER — Encounter (HOSPITAL_COMMUNITY): Payer: Self-pay

## 2018-09-19 DIAGNOSIS — Z01812 Encounter for preprocedural laboratory examination: Secondary | ICD-10-CM | POA: Insufficient documentation

## 2018-09-19 HISTORY — DX: Prediabetes: R73.03

## 2018-09-19 LAB — CBC WITH DIFFERENTIAL/PLATELET
ABS IMMATURE GRANULOCYTES: 0.1 10*3/uL — AB (ref 0.00–0.07)
BASOS ABS: 0.1 10*3/uL (ref 0.0–0.1)
Basophils Relative: 1 %
Eosinophils Absolute: 0.1 10*3/uL (ref 0.0–0.5)
Eosinophils Relative: 1 %
HEMATOCRIT: 46.6 % — AB (ref 36.0–46.0)
HEMOGLOBIN: 14.2 g/dL (ref 12.0–15.0)
IMMATURE GRANULOCYTES: 1 %
LYMPHS ABS: 2.2 10*3/uL (ref 0.7–4.0)
LYMPHS PCT: 16 %
MCH: 27.2 pg (ref 26.0–34.0)
MCHC: 30.5 g/dL (ref 30.0–36.0)
MCV: 89.1 fL (ref 80.0–100.0)
Monocytes Absolute: 1.5 10*3/uL — ABNORMAL HIGH (ref 0.1–1.0)
Monocytes Relative: 11 %
NEUTROS ABS: 9.4 10*3/uL — AB (ref 1.7–7.7)
NEUTROS PCT: 70 %
NRBC: 0 % (ref 0.0–0.2)
Platelets: 305 10*3/uL (ref 150–400)
RBC: 5.23 MIL/uL — AB (ref 3.87–5.11)
RDW: 14.4 % (ref 11.5–15.5)
WBC: 13.5 10*3/uL — AB (ref 4.0–10.5)

## 2018-09-19 LAB — BASIC METABOLIC PANEL
Anion gap: 13 (ref 5–15)
BUN: 16 mg/dL (ref 8–23)
CHLORIDE: 104 mmol/L (ref 98–111)
CO2: 22 mmol/L (ref 22–32)
CREATININE: 1.08 mg/dL — AB (ref 0.44–1.00)
Calcium: 9 mg/dL (ref 8.9–10.3)
GFR calc non Af Amer: 54 mL/min — ABNORMAL LOW (ref >60)
Glucose, Bld: 94 mg/dL (ref 70–99)
POTASSIUM: 5.3 mmol/L — AB (ref 3.5–5.1)
SODIUM: 139 mmol/L (ref 135–145)

## 2018-09-19 LAB — SURGICAL PCR SCREEN
MRSA, PCR: NEGATIVE
Staphylococcus aureus: POSITIVE — AB

## 2018-09-19 LAB — TYPE AND SCREEN
ABO/RH(D): A POS
ANTIBODY SCREEN: NEGATIVE

## 2018-09-20 NOTE — Progress Notes (Addendum)
Anesthesia Chart Review:  Case:  [id number] Date/Time:  09/26/18 1327   Procedure:  ACDF C4-C5 - C5-C6 - C6-C7 (N/A )   Anesthesia type:  General   Pre-op diagnosis:  Stenosis   Location:  [facility] OR ROOM 19 / [facility] OR   Surgeon:  Julio SicksPool, Henry, MD      DISCUSSION: 62 yo female never smoker. Pertinent hx includes HTN, OSA, TIA, Graves disease, Adrenal insufficiency from chronic steroid use, CKD III, GERD, corneal dystrophy.  Per cardiology notes in care everywhere [facility] after having equivocal stress test at outside facility. Cath done 01/16/2013 showed mild nonobstructive ASCAD, EF 60%, normal wall motion.  [facility] for chest pain. Per notes in care everywhere: "Serial cardiac enzymes were negative. A nuclear stress test was negative for inducible ischemia and she has had no further symptoms. Upon review, her symptoms may have been from GERD. She is clinically stable for discharge to home."  Her chronic adrenal insufficiency is managed by her PCP. She is on long term replacement steroids. Dr. Demetra ShinerGulley wrote instructions for periop stress dosing as follows:  "The day before surgery she should take her usual steroids.  In holding area before surgery she should be given 50 mg hydrocortisone IV and then 25 mg every 8 hours for 24 hours.  On the day following surgery if she is taking oral meds she can just resume her usual dose of steroid.  If remains acutely ill, for example persistent nausea or fever then continue with 25 mg hydrocortisone IV twice daily until her acute illness resolves and then resume her baseline replacement."  There is also a copy of the instructions on pt's chart.  Anticipate she can proceed as planned barring acute status change.   VS: BP (!) 141/55   Pulse 79   Temp 36.8 C   Resp 20   Ht 5\' 6"  (1.676 m)   Wt 94.3 kg   SpO2 96%   BMI 33.56 kg/m   PROVIDERS: Santiago GladGulley, Paul, MD is PCP  Merrilee JanskyGreenwood,  Gregory, MD is Nephrologist   LABS: Labs reviewed: Acceptable for surgery. (all labs ordered are listed, but only abnormal results are displayed)  Labs Reviewed  SURGICAL PCR SCREEN - Abnormal; Notable for the following components:      Result Value   Staphylococcus aureus POSITIVE (*)    All other components within normal limits  BASIC METABOLIC PANEL - Abnormal; Notable for the following components:   Potassium 5.3 (*)    Creatinine, Ser 1.08 (*)    GFR calc non Af Amer 54 (*)    All other components within normal limits  CBC WITH DIFFERENTIAL/PLATELET - Abnormal; Notable for the following components:   WBC 13.5 (*)    RBC 5.23 (*)    HCT 46.6 (*)    Neutro Abs 9.4 (*)    Monocytes Absolute 1.5 (*)    Abs Immature Granulocytes 0.10 (*)    All other components within normal limits  TYPE AND SCREEN     IMAGES: XR Chest AP portable 05/26/2017 (care everywhere): FINDINGS: The heart and pulmonary vessels are within normal limits.  Mild scarring both bases. No infiltrates or effusions. There is no pneumothorax. No bony abnormalities are evident.   IMPRESSION: No active intrathoracic disease.  EKG: 09/20/2017: Normal sinus rhythm. Rate 72. Non-specific ST-t changes  PULM: PFT 04/05/2017 (care everywhere): Component Name Value Ref Range  FEV1  2.34  Comment: 107% liters  FVC 3.09  Comment: 103% liters  FEV1/FVC 76  Comment: 76% %  TLC 4.78  Comment: 98% liters  DLCO 18.3  Comment: 78% ml/mmHg sec  PEAK FLOW   Comment: 96% 20 - 800 L/MIN  Result Impression  Normal spirometry lung volumes and diffusing capacity    CV: Nuclear stress 05/27/2017 (care everywhere): FINDINGS: There is normal physiologic distribution of activity throughout the left ventricular myocardium. No evidence of ischemia. No regional wall motion abnormality. Left ventricular ejection fraction is calculated to be 64%. Ejection fraction was 69% on old exam.  IMPRESSION: Exam within normal  limits.  Cath 01/16/2013 (care everywhere): CONCLUSIONS:   CORONARY STATUS: Mild non-obstructive ASCAD   LV FUNCTION:  Ejection Fraction: 60%  Wall Motion: Normal    OTHER:Widely patent coronaries. Normal left main. Normal left ventricular function. Rt brachiocephalic artery 75% prox.Gradients peak, mean 20, 6 mmHg   Past Medical History:  Diagnosis Date  . Adrenal insufficiency (HCC)   . Arthritis   . Depression   . Fibromyalgia   . GERD (gastroesophageal reflux disease)   . History of hiatal hernia   . Hypertension   . Hyperthyroidism   . Hypoglycemia   . Myocardial infarction (HCC)    mild in 2011, is not required to see cardiology now  . Pneumonia   . 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 Sleep apnea   . Spondylolisthesis of lumbar region   . TIA (transient ischemic attack)     Past Surgical History:  Procedure Laterality Date  . ABDOMINAL HYSTERECTOMY    . APPENDECTOMY    . CARDIAC CATHETERIZATION    . CHOLECYSTECTOMY    . COLONOSCOPY    . DILATION AND CURETTAGE OF UTERUS    . OOPHORECTOMY     Bilateral  . OVARIAN CYST REMOVAL    . TONSILLECTOMY      MEDICATIONS: . albuterol (PROVENTIL HFA;VENTOLIN HFA) 108 (90 Base) MCG/ACT inhaler  . CALCIUM-MAGNESIUM PO  . dexamethasone (DECADRON) 0.75 MG tablet  . diazepam (VALIUM) 5 MG tablet  . doxycycline (VIBRA-TABS) 100 MG tablet  . ergocalciferol (VITAMIN D2) 1.25 MG (50000 UT) capsule  . escitalopram (LEXAPRO) 10 MG tablet  . esomeprazole (NEXIUM) 20 MG capsule  . gabapentin (NEURONTIN) 300 MG capsule  . Melatonin 5 MG CAPS  . metoprolol succinate (TOPROL-XL) 25 MG 24 hr tablet  . Omega-3 Fatty Acids (FISH OIL) 1000 MG CAPS  . orphenadrine (NORFLEX) 100 MG tablet  . oxyCODONE-acetaminophen (PERCOCET/ROXICET) 5-325 MG tablet  . Probiotic Product (PROBIOTIC PO)  . promethazine (PHENERGAN) 25 MG tablet  . RESTASIS 0.05 % ophthalmic  emulsion  . traZODone (DESYREL) 100 MG tablet   No current facility-administered medications for this encounter.      Zannie Cove [facility] Short Stay Center/Anesthesiology Phone [phone] 09/20/2018 11:48 AM

## 2018-09-20 NOTE — Anesthesia Preprocedure Evaluation (Deleted)
Anesthesia Evaluation    Airway        Dental   Pulmonary           Cardiovascular hypertension,      Neuro/Psych    GI/Hepatic   Endo/Other    Renal/GU      Musculoskeletal   Abdominal   Peds  Hematology   Anesthesia Other Findings   Reproductive/Obstetrics                                                              Anesthesia Evaluation  Patient identified by MRN, date of birth, ID band Patient awake    Reviewed: Allergy & Precautions, NPO status , Patient's Chart, lab work & pertinent test results, reviewed documented beta blocker date and time   Airway Mallampati: III  TM Distance: >3 FB Neck ROM: Full    Dental no notable dental hx. (+) Teeth Intact, Dental Advisory Given   Pulmonary neg pulmonary ROS, sleep apnea ,    Pulmonary exam normal breath sounds clear to auscultation       Cardiovascular hypertension, Pt. on home beta blockers and Pt. on medications + CAD  Normal cardiovascular exam Rhythm:Regular Rate:Normal  ECG: NSR, rate 72  Nuclear stress test 05/27/17 (care everywhere):  Exam within normal limits.  Echo 04/22/17 [facility]):  1. Normal LV size and systolic function. LVH. Diastolic dysfunction. 2. Mild mitral regurgitation. 3. Moderate tricuspid regurgitation. 4. Mild to moderate pulmonary hypertension.  Cardiac cath 01/16/13 (care everywhere):  Mild non-obstructive ASCAD (mid RCA 10%) Ejection Fraction: 60% Wall Motion: Normal      Neuro/Psych PSYCHIATRIC DISORDERS Depression TIA   GI/Hepatic Neg liver ROS, hiatal hernia, GERD  Medicated and Controlled,  Endo/Other  Hyperthyroidism   Renal/GU negative Renal ROS     Musculoskeletal  (+) Arthritis , Osteoarthritis,  Fibromyalgia -Spondylolisthesis   Abdominal (+) + obese,   Peds  Hematology negative hematology ROS (+)   Anesthesia  Other Findings Adrenal insufficiency Endocrinologist is Santiago Glad, MD who cleared pt for surgery noting "in holding area before surgery she should be given 50 mg hydrocortisone IV and then 25 mg every 8 hours for 24 hours. On day following surgery if she is taking oral meds she can just resume her usual replacement steroids. If she remains acutely ill for example persistent nausea or fever, then continue 25 mg hydrocortisone IV twice daily until her acute illness resolves and resume her baseline replacement"  Reproductive/Obstetrics                           Anesthesia Physical Anesthesia Plan  ASA: III  Anesthesia Plan: General   Post-op Pain Management:    Induction: Intravenous  PONV Risk Score and Plan: 3 and Midazolam, Dexamethasone and Ondansetron  Airway Management Planned: Oral ETT  Additional Equipment:   Intra-op Plan:   Post-operative Plan: Extubation in OR  Informed Consent: I have reviewed the patients History and Physical, chart, labs and discussed the procedure including the risks, benefits and alternatives for the proposed anesthesia with the patient or authorized representative who has indicated his/her understanding and acceptance.   Dental advisory given  Plan Discussed with: CRNA, Anesthesiologist and Surgeon  Anesthesia Plan Comments:  Anesthesia Quick Evaluation                                   Anesthesia Evaluation  Patient identified by MRN, date of birth, ID band Patient awake    Reviewed: Allergy & Precautions, NPO status , Patient's Chart, lab work & pertinent test results, reviewed documented beta blocker date and time   Airway Mallampati: III  TM Distance: >3 FB Neck ROM: Full    Dental no notable dental hx. (+) Teeth Intact, Dental Advisory Given   Pulmonary neg pulmonary ROS, sleep apnea ,    Pulmonary exam normal breath sounds clear to auscultation       Cardiovascular hypertension,  Pt. on home beta blockers and Pt. on medications + CAD  Normal cardiovascular exam Rhythm:Regular Rate:Normal  ECG: NSR, rate 72  Nuclear stress test 05/27/17 (care everywhere):  Exam within normal limits.  Echo 04/22/17 [facility]):  1. Normal LV size and systolic function. LVH. Diastolic dysfunction. 2. Mild mitral regurgitation. 3. Moderate tricuspid regurgitation. 4. Mild to moderate pulmonary hypertension.  Cardiac cath 01/16/13 (care everywhere):  Mild non-obstructive ASCAD (mid RCA 10%) Ejection Fraction: 60% Wall Motion: Normal      Neuro/Psych PSYCHIATRIC DISORDERS Depression TIA   GI/Hepatic Neg liver ROS, hiatal hernia, GERD  Medicated and Controlled,  Endo/Other  Hyperthyroidism   Renal/GU negative Renal ROS     Musculoskeletal  (+) Arthritis , Osteoarthritis,  Fibromyalgia -Spondylolisthesis   Abdominal (+) + obese,   Peds  Hematology negative hematology ROS (+)   Anesthesia Other Findings Adrenal insufficiency Endocrinologist is Santiago GladPaul Gulley, MD who cleared pt for surgery noting "in holding area before surgery she should be given 50 mg hydrocortisone IV and then 25 mg every 8 hours for 24 hours. On day following surgery if she is taking oral meds she can just resume her usual replacement steroids. If she remains acutely ill for example persistent nausea or fever, then continue 25 mg hydrocortisone IV twice daily until her acute illness resolves and resume her baseline replacement"  Reproductive/Obstetrics                           Anesthesia Physical Anesthesia Plan  ASA: III  Anesthesia Plan: General   Post-op Pain Management:    Induction: Intravenous  PONV Risk Score and Plan: 3 and Midazolam, Dexamethasone and Ondansetron  Airway Management Planned: Oral ETT  Additional Equipment:   Intra-op Plan:   Post-operative Plan: Extubation in OR  Informed Consent: I have reviewed  the patients History and Physical, chart, labs and discussed the procedure including the risks, benefits and alternatives for the proposed anesthesia with the patient or authorized representative who has indicated his/her understanding and acceptance.   Dental advisory given  Plan Discussed with: CRNA, Anesthesiologist and Surgeon  Anesthesia Plan Comments:        Anesthesia Quick Evaluation  Anesthesia Physical Anesthesia Plan  ASA:   Anesthesia Plan:    Post-op Pain Management:    Induction:   PONV Risk Score and Plan:   Airway Management Planned:   Additional Equipment:   Intra-op Plan:   Post-operative Plan:   Informed Consent:   Plan Discussed with:   Anesthesia Plan Comments: (See PAT note 09/19/2018 by Antionette PolesJames Burns, PA-C  )        Anesthesia Quick Evaluation

## 2018-09-26 ENCOUNTER — Encounter (HOSPITAL_COMMUNITY)
Admission: RE | Disposition: A | Discharge status: Home / Self Care | Payer: Self-pay | Source: Home / Self Care | Attending: Neurosurgery

## 2018-09-26 ENCOUNTER — Other Ambulatory Visit: Payer: Self-pay

## 2018-09-26 ENCOUNTER — Inpatient Hospital Stay (HOSPITAL_COMMUNITY): Payer: Medicare HMO

## 2018-09-26 ENCOUNTER — Inpatient Hospital Stay (HOSPITAL_COMMUNITY): Payer: Medicare HMO | Admitting: Certified Registered Nurse Anesthetist

## 2018-09-26 ENCOUNTER — Encounter (HOSPITAL_COMMUNITY): Payer: Self-pay

## 2018-09-26 ENCOUNTER — Inpatient Hospital Stay (HOSPITAL_COMMUNITY)
Admission: RE | Admit: 2018-09-26 | Discharge: 2018-09-27 | DRG: 473 | Disposition: A | Discharge status: Home / Self Care | Payer: Medicare HMO | Attending: Neurosurgery | Admitting: Neurosurgery

## 2018-09-26 ENCOUNTER — Inpatient Hospital Stay (HOSPITAL_COMMUNITY): Payer: Medicare HMO | Admitting: Physician Assistant

## 2018-09-26 DIAGNOSIS — M4722 Other spondylosis with radiculopathy, cervical region: Principal | ICD-10-CM | POA: Diagnosis present

## 2018-09-26 DIAGNOSIS — Z882 Allergy status to sulfonamides status: Secondary | ICD-10-CM | POA: Diagnosis not present

## 2018-09-26 DIAGNOSIS — Z886 Allergy status to analgesic agent status: Secondary | ICD-10-CM

## 2018-09-26 DIAGNOSIS — Z888 Allergy status to other drugs, medicaments and biological substances status: Secondary | ICD-10-CM

## 2018-09-26 DIAGNOSIS — I10 Essential (primary) hypertension: Secondary | ICD-10-CM | POA: Diagnosis present

## 2018-09-26 DIAGNOSIS — M5412 Radiculopathy, cervical region: Secondary | ICD-10-CM | POA: Diagnosis present

## 2018-09-26 DIAGNOSIS — I252 Old myocardial infarction: Secondary | ICD-10-CM

## 2018-09-26 DIAGNOSIS — Z881 Allergy status to other antibiotic agents status: Secondary | ICD-10-CM | POA: Diagnosis not present

## 2018-09-26 DIAGNOSIS — Z79899 Other long term (current) drug therapy: Secondary | ICD-10-CM | POA: Diagnosis not present

## 2018-09-26 DIAGNOSIS — Z79891 Long term (current) use of opiate analgesic: Secondary | ICD-10-CM | POA: Diagnosis not present

## 2018-09-26 DIAGNOSIS — Z8673 Personal history of transient ischemic attack (TIA), and cerebral infarction without residual deficits: Secondary | ICD-10-CM | POA: Diagnosis not present

## 2018-09-26 DIAGNOSIS — R7303 Prediabetes: Secondary | ICD-10-CM | POA: Diagnosis present

## 2018-09-26 DIAGNOSIS — Z419 Encounter for procedure for purposes other than remedying health state, unspecified: Secondary | ICD-10-CM

## 2018-09-26 DIAGNOSIS — Z91040 Latex allergy status: Secondary | ICD-10-CM

## 2018-09-26 DIAGNOSIS — Z880 Allergy status to penicillin: Secondary | ICD-10-CM | POA: Diagnosis not present

## 2018-09-26 DIAGNOSIS — M4802 Spinal stenosis, cervical region: Secondary | ICD-10-CM | POA: Diagnosis present

## 2018-09-26 HISTORY — PX: ANTERIOR CERVICAL DECOMP/DISCECTOMY FUSION: SHX1161

## 2018-09-26 HISTORY — DX: Spinal stenosis, cervical region: M48.02

## 2018-09-26 LAB — GLUCOSE, CAPILLARY: Glucose-Capillary: 92 mg/dL (ref 70–99)

## 2018-09-26 SURGERY — ANTERIOR CERVICAL DECOMPRESSION/DISCECTOMY FUSION 3 LEVELS
Anesthesia: General

## 2018-09-26 MED ORDER — ONDANSETRON HCL 4 MG/2ML IJ SOLN: 4.0000 mg | Freq: Once | INTRAMUSCULAR | Status: DC | PRN

## 2018-09-26 MED ORDER — ONDANSETRON HCL 4 MG/2ML IJ SOLN
INTRAMUSCULAR | Status: AC
  Filled 2018-09-26: qty 2

## 2018-09-26 MED ORDER — DEXAMETHASONE 0.5 MG PO TABS
0.7500 mg | ORAL_TABLET | Freq: Every day | ORAL | Status: DC
  Administered 2018-09-26 – 2018-09-27 (×2): 0.75 mg via ORAL
  Filled 2018-09-26 (×2): qty 1.5

## 2018-09-26 MED ORDER — ALBUTEROL SULFATE (2.5 MG/3ML) 0.083% IN NEBU: 2.5000 mg | INHALATION_SOLUTION | Freq: Four times a day (QID) | RESPIRATORY_TRACT | Status: DC | PRN

## 2018-09-26 MED ORDER — BACID PO TABS
ORAL_TABLET | Freq: Every day | ORAL | Status: DC
  Administered 2018-09-26 – 2018-09-27 (×2): 1 via ORAL
  Filled 2018-09-26 (×2): qty 1

## 2018-09-26 MED ORDER — DIPHENHYDRAMINE HCL 50 MG/ML IJ SOLN
INTRAMUSCULAR | Status: AC
  Filled 2018-09-26: qty 1

## 2018-09-26 MED ORDER — ACETAMINOPHEN 325 MG PO TABS: 325.0000 mg | ORAL_TABLET | ORAL | Status: DC | PRN

## 2018-09-26 MED ORDER — TRAZODONE HCL 100 MG PO TABS
200.0000 mg | ORAL_TABLET | Freq: Every day | ORAL | Status: DC
  Administered 2018-09-26: 200 mg via ORAL
  Filled 2018-09-26: qty 2

## 2018-09-26 MED ORDER — HYDROCODONE-ACETAMINOPHEN 5-325 MG PO TABS: 1.0000 | ORAL_TABLET | ORAL | Status: DC | PRN

## 2018-09-26 MED ORDER — METOPROLOL SUCCINATE ER 25 MG PO TB24
25.0000 mg | ORAL_TABLET | Freq: Every day | ORAL | Status: DC
  Administered 2018-09-26: 25 mg via ORAL
  Filled 2018-09-26: qty 1

## 2018-09-26 MED ORDER — DEXAMETHASONE SODIUM PHOSPHATE 10 MG/ML IJ SOLN
10.0000 mg | INTRAMUSCULAR | Status: DC
  Filled 2018-09-26: qty 1

## 2018-09-26 MED ORDER — OXYCODONE HCL 5 MG/5ML PO SOLN: 5.0000 mg | Freq: Once | ORAL | Status: AC | PRN

## 2018-09-26 MED ORDER — SODIUM CHLORIDE 0.9 % IV SOLN
INTRAVENOUS | Status: DC | PRN
  Administered 2018-09-26: 30 ug/min via INTRAVENOUS

## 2018-09-26 MED ORDER — OXYCODONE HCL 5 MG PO TABS
ORAL_TABLET | ORAL | Status: AC
  Filled 2018-09-26: qty 1

## 2018-09-26 MED ORDER — ROCURONIUM BROMIDE 50 MG/5ML IV SOSY
PREFILLED_SYRINGE | INTRAVENOUS | Status: AC
  Filled 2018-09-26: qty 5

## 2018-09-26 MED ORDER — DIPHENHYDRAMINE HCL 50 MG/ML IJ SOLN
INTRAMUSCULAR | Status: DC | PRN
  Administered 2018-09-26: 12.5 mg via INTRAVENOUS

## 2018-09-26 MED ORDER — SODIUM CHLORIDE 0.9% FLUSH: 3.0000 mL | INTRAVENOUS | Status: DC | PRN

## 2018-09-26 MED ORDER — DIAZEPAM 5 MG PO TABS
5.0000 mg | ORAL_TABLET | Freq: Four times a day (QID) | ORAL | Status: DC | PRN
  Administered 2018-09-26 (×2): 5 mg via ORAL
  Filled 2018-09-26: qty 1

## 2018-09-26 MED ORDER — CHLORHEXIDINE GLUCONATE CLOTH 2 % EX PADS: 6.0000 | MEDICATED_PAD | Freq: Once | CUTANEOUS | Status: DC

## 2018-09-26 MED ORDER — GLYCOPYRROLATE PF 0.2 MG/ML IJ SOSY
PREFILLED_SYRINGE | INTRAMUSCULAR | Status: DC | PRN
  Administered 2018-09-26: .1 mg via INTRAVENOUS

## 2018-09-26 MED ORDER — THROMBIN 5000 UNITS EX SOLR
CUTANEOUS | Status: AC
  Filled 2018-09-26: qty 5000

## 2018-09-26 MED ORDER — PROPOFOL 10 MG/ML IV BOLUS
INTRAVENOUS | Status: DC | PRN
  Administered 2018-09-26: 200 mg via INTRAVENOUS

## 2018-09-26 MED ORDER — CALCIUM-MAGNESIUM 100-50 MG PO TABS: ORAL_TABLET | Freq: Every day | ORAL | Status: DC

## 2018-09-26 MED ORDER — ACETAMINOPHEN 650 MG RE SUPP: 650.0000 mg | RECTAL | Status: DC | PRN

## 2018-09-26 MED ORDER — FENTANYL CITRATE (PF) 100 MCG/2ML IJ SOLN
INTRAMUSCULAR | Status: AC
  Filled 2018-09-26: qty 2

## 2018-09-26 MED ORDER — SODIUM CHLORIDE 0.9 % IV SOLN
INTRAVENOUS | Status: DC | PRN
  Administered 2018-09-26: 13:00:00

## 2018-09-26 MED ORDER — MEPERIDINE HCL 50 MG/ML IJ SOLN: 6.2500 mg | INTRAMUSCULAR | Status: DC | PRN

## 2018-09-26 MED ORDER — SUGAMMADEX SODIUM 200 MG/2ML IV SOLN
INTRAVENOUS | Status: DC | PRN
  Administered 2018-09-26: 188.6 mg via INTRAVENOUS

## 2018-09-26 MED ORDER — ONDANSETRON HCL 4 MG/2ML IJ SOLN: 4.0000 mg | Freq: Four times a day (QID) | INTRAMUSCULAR | Status: DC | PRN

## 2018-09-26 MED ORDER — HYDROXYZINE HCL 50 MG/ML IM SOLN
50.0000 mg | Freq: Four times a day (QID) | INTRAMUSCULAR | Status: DC | PRN
  Administered 2018-09-26: 50 mg via INTRAMUSCULAR
  Filled 2018-09-26: qty 1

## 2018-09-26 MED ORDER — CYCLOBENZAPRINE HCL 10 MG PO TABS
10.0000 mg | ORAL_TABLET | Freq: Three times a day (TID) | ORAL | Status: DC | PRN
  Administered 2018-09-26 – 2018-09-27 (×2): 10 mg via ORAL
  Filled 2018-09-26 (×2): qty 1

## 2018-09-26 MED ORDER — ONDANSETRON HCL 4 MG/2ML IJ SOLN
INTRAMUSCULAR | Status: DC | PRN
  Administered 2018-09-26: 4 mg via INTRAVENOUS

## 2018-09-26 MED ORDER — DEXAMETHASONE SODIUM PHOSPHATE 10 MG/ML IJ SOLN
INTRAMUSCULAR | Status: DC | PRN
  Administered 2018-09-26: 10 mg via INTRAVENOUS

## 2018-09-26 MED ORDER — GLYCOPYRROLATE PF 0.2 MG/ML IJ SOSY
PREFILLED_SYRINGE | INTRAMUSCULAR | Status: AC
  Filled 2018-09-26: qty 1

## 2018-09-26 MED ORDER — OMEGA-3-ACID ETHYL ESTERS 1 G PO CAPS
2.0000 g | ORAL_CAPSULE | Freq: Two times a day (BID) | ORAL | Status: DC
  Administered 2018-09-26 – 2018-09-27 (×2): 2 g via ORAL
  Filled 2018-09-26 (×2): qty 2

## 2018-09-26 MED ORDER — ORPHENADRINE CITRATE ER 100 MG PO TB12
100.0000 mg | ORAL_TABLET | Freq: Two times a day (BID) | ORAL | Status: DC | PRN
  Filled 2018-09-26: qty 1

## 2018-09-26 MED ORDER — ALBUMIN HUMAN 5 % IV SOLN
INTRAVENOUS | Status: DC | PRN
  Administered 2018-09-26: 14:00:00 via INTRAVENOUS

## 2018-09-26 MED ORDER — LIDOCAINE 2% (20 MG/ML) 5 ML SYRINGE
INTRAMUSCULAR | Status: DC | PRN
  Administered 2018-09-26: 100 mg via INTRAVENOUS

## 2018-09-26 MED ORDER — LIDOCAINE 2% (20 MG/ML) 5 ML SYRINGE
INTRAMUSCULAR | Status: AC
  Filled 2018-09-26: qty 5

## 2018-09-26 MED ORDER — FENTANYL CITRATE (PF) 250 MCG/5ML IJ SOLN
INTRAMUSCULAR | Status: AC
  Filled 2018-09-26: qty 5

## 2018-09-26 MED ORDER — ACETAMINOPHEN 160 MG/5ML PO SOLN: 325.0000 mg | ORAL | Status: DC | PRN

## 2018-09-26 MED ORDER — ACETAMINOPHEN 325 MG PO TABS: 650.0000 mg | ORAL_TABLET | ORAL | Status: DC | PRN

## 2018-09-26 MED ORDER — PHENYLEPHRINE 40 MCG/ML (10ML) SYRINGE FOR IV PUSH (FOR BLOOD PRESSURE SUPPORT)
PREFILLED_SYRINGE | INTRAVENOUS | Status: DC | PRN
  Administered 2018-09-26 (×2): 120 ug via INTRAVENOUS

## 2018-09-26 MED ORDER — VITAMIN D (ERGOCALCIFEROL) 1.25 MG (50000 UNIT) PO CAPS
50000.0000 [IU] | ORAL_CAPSULE | ORAL | Status: DC
  Administered 2018-09-27: 50000 [IU] via ORAL
  Filled 2018-09-26: qty 1

## 2018-09-26 MED ORDER — SODIUM CHLORIDE 0.9% FLUSH
3.0000 mL | Freq: Two times a day (BID) | INTRAVENOUS | Status: DC
  Administered 2018-09-26: 3 mL via INTRAVENOUS

## 2018-09-26 MED ORDER — ESCITALOPRAM OXALATE 10 MG PO TABS
10.0000 mg | ORAL_TABLET | Freq: Every day | ORAL | Status: DC
  Administered 2018-09-27: 10 mg via ORAL
  Filled 2018-09-26: qty 1

## 2018-09-26 MED ORDER — FENTANYL CITRATE (PF) 100 MCG/2ML IJ SOLN
INTRAMUSCULAR | Status: DC | PRN
  Administered 2018-09-26 (×3): 25 ug via INTRAVENOUS
  Administered 2018-09-26 (×3): 50 ug via INTRAVENOUS
  Administered 2018-09-26: 25 ug via INTRAVENOUS

## 2018-09-26 MED ORDER — OXYCODONE HCL 5 MG PO TABS
5.0000 mg | ORAL_TABLET | Freq: Once | ORAL | Status: AC | PRN
  Administered 2018-09-26: 5 mg via ORAL

## 2018-09-26 MED ORDER — LACTATED RINGERS IV SOLN
INTRAVENOUS | Status: DC | PRN
  Administered 2018-09-26 (×2): via INTRAVENOUS

## 2018-09-26 MED ORDER — DIAZEPAM 5 MG PO TABS
ORAL_TABLET | ORAL | Status: AC
  Filled 2018-09-26: qty 1

## 2018-09-26 MED ORDER — VANCOMYCIN HCL IN DEXTROSE 1-5 GM/200ML-% IV SOLN
1000.0000 mg | Freq: Once | INTRAVENOUS | Status: AC
  Administered 2018-09-27: 1000 mg via INTRAVENOUS
  Filled 2018-09-26: qty 200

## 2018-09-26 MED ORDER — SODIUM CHLORIDE 0.9 % IV SOLN: 250.0000 mL | INTRAVENOUS | Status: DC

## 2018-09-26 MED ORDER — DOXYCYCLINE HYCLATE 100 MG PO TABS
100.0000 mg | ORAL_TABLET | Freq: Two times a day (BID) | ORAL | Status: DC
  Administered 2018-09-26 – 2018-09-27 (×2): 100 mg via ORAL
  Filled 2018-09-26 (×2): qty 1

## 2018-09-26 MED ORDER — HYDROMORPHONE HCL 1 MG/ML IJ SOLN
1.0000 mg | INTRAMUSCULAR | Status: DC | PRN
  Administered 2018-09-26 (×2): 1 mg via INTRAVENOUS
  Filled 2018-09-26 (×2): qty 1

## 2018-09-26 MED ORDER — MENTHOL 3 MG MT LOZG: 1.0000 | LOZENGE | OROMUCOSAL | Status: DC | PRN

## 2018-09-26 MED ORDER — HYDROCODONE-ACETAMINOPHEN 10-325 MG PO TABS
2.0000 | ORAL_TABLET | ORAL | Status: DC | PRN
  Administered 2018-09-27 (×2): 2 via ORAL
  Filled 2018-09-26 (×2): qty 2

## 2018-09-26 MED ORDER — LACTATED RINGERS IV SOLN
Freq: Once | INTRAVENOUS | Status: AC
Start: 2018-09-26 — End: 2018-09-27
  Administered 2018-09-26: 10:00:00 via INTRAVENOUS

## 2018-09-26 MED ORDER — THROMBIN 20000 UNITS EX SOLR
CUTANEOUS | Status: DC | PRN
  Administered 2018-09-26: 13:00:00 via TOPICAL

## 2018-09-26 MED ORDER — PHENOL 1.4 % MT LIQD: 1.0000 | OROMUCOSAL | Status: DC | PRN

## 2018-09-26 MED ORDER — VANCOMYCIN HCL IN DEXTROSE 1-5 GM/200ML-% IV SOLN
1000.0000 mg | INTRAVENOUS | Status: AC
  Administered 2018-09-26: 1000 mg via INTRAVENOUS
  Filled 2018-09-26: qty 200

## 2018-09-26 MED ORDER — OXYCODONE-ACETAMINOPHEN 5-325 MG PO TABS: 1.0000 | ORAL_TABLET | Freq: Four times a day (QID) | ORAL | Status: DC | PRN

## 2018-09-26 MED ORDER — ROCURONIUM BROMIDE 10 MG/ML (PF) SYRINGE
PREFILLED_SYRINGE | INTRAVENOUS | Status: DC | PRN
  Administered 2018-09-26 (×2): 20 mg via INTRAVENOUS
  Administered 2018-09-26: 50 mg via INTRAVENOUS

## 2018-09-26 MED ORDER — CYCLOSPORINE 0.05 % OP EMUL
1.0000 [drp] | Freq: Two times a day (BID) | OPHTHALMIC | Status: DC
  Administered 2018-09-26 – 2018-09-27 (×2): 1 [drp] via OPHTHALMIC
  Filled 2018-09-26 (×2): qty 1

## 2018-09-26 MED ORDER — MELATONIN 3 MG PO TABS
9.0000 mg | ORAL_TABLET | Freq: Every day | ORAL | Status: DC
  Administered 2018-09-26: 9 mg via ORAL
  Filled 2018-09-26: qty 3

## 2018-09-26 MED ORDER — THROMBIN 20000 UNITS EX SOLR
CUTANEOUS | Status: AC
  Filled 2018-09-26: qty 20000

## 2018-09-26 MED ORDER — 0.9 % SODIUM CHLORIDE (POUR BTL) OPTIME
TOPICAL | Status: DC | PRN
  Administered 2018-09-26: 1000 mL

## 2018-09-26 MED ORDER — HYDROCORTISONE NA SUCCINATE PF 100 MG IJ SOLR
50.0000 mg | Freq: Once | INTRAMUSCULAR | Status: AC
  Administered 2018-09-26: 50 mg via INTRAVENOUS
  Filled 2018-09-26: qty 1

## 2018-09-26 MED ORDER — DEXAMETHASONE SODIUM PHOSPHATE 10 MG/ML IJ SOLN
INTRAMUSCULAR | Status: AC
  Filled 2018-09-26: qty 1

## 2018-09-26 MED ORDER — HYDROCORTISONE NA SUCCINATE PF 100 MG IJ SOLR
50.0000 mg | Freq: Three times a day (TID) | INTRAMUSCULAR | Status: DC
  Administered 2018-09-26 – 2018-09-27 (×2): 50 mg via INTRAVENOUS
  Filled 2018-09-26 (×3): qty 1

## 2018-09-26 MED ORDER — DEXMEDETOMIDINE HCL 200 MCG/2ML IV SOLN
INTRAVENOUS | Status: DC | PRN
  Administered 2018-09-26: 4 ug via INTRAVENOUS
  Administered 2018-09-26 (×2): 8 ug via INTRAVENOUS

## 2018-09-26 MED ORDER — PROPOFOL 10 MG/ML IV BOLUS
INTRAVENOUS | Status: AC
  Filled 2018-09-26: qty 20

## 2018-09-26 MED ORDER — ONDANSETRON HCL 4 MG PO TABS: 4.0000 mg | ORAL_TABLET | Freq: Four times a day (QID) | ORAL | Status: DC | PRN

## 2018-09-26 MED ORDER — PANTOPRAZOLE SODIUM 40 MG PO PACK
40.0000 mg | PACK | Freq: Every day | ORAL | Status: DC
  Administered 2018-09-27: 40 mg via ORAL
  Filled 2018-09-26: qty 20

## 2018-09-26 MED ORDER — GABAPENTIN 300 MG PO CAPS
300.0000 mg | ORAL_CAPSULE | Freq: Two times a day (BID) | ORAL | Status: DC
  Administered 2018-09-26 – 2018-09-27 (×2): 300 mg via ORAL
  Filled 2018-09-26 (×2): qty 1

## 2018-09-26 MED ORDER — FENTANYL CITRATE (PF) 100 MCG/2ML IJ SOLN
25.0000 ug | INTRAMUSCULAR | Status: DC | PRN
  Administered 2018-09-26 (×2): 50 ug via INTRAVENOUS

## 2018-09-26 MED ORDER — PROMETHAZINE HCL 25 MG PO TABS: 25.0000 mg | ORAL_TABLET | Freq: Four times a day (QID) | ORAL | Status: DC | PRN

## 2018-09-26 SURGICAL SUPPLY — 57 items
BAG DECANTER FOR FLEXI CONT (MISCELLANEOUS) ×3 IMPLANT
BENZOIN TINCTURE PRP APPL 2/3 (GAUZE/BANDAGES/DRESSINGS) ×3 IMPLANT
BIT DRILL 13 (BIT) ×2 IMPLANT
BIT DRILL 13MM (BIT) ×1
BUR MATCHSTICK NEURO 3.0 LAGG (BURR) ×3 IMPLANT
CAGE PEEK 6X14X11 (Cage) ×4 IMPLANT
CAGE PEEK 7X14X11 (Cage) ×2 IMPLANT
CAGE SPNL 11X14X6XRADOPQ (Cage) ×1 IMPLANT
CANISTER SUCT 3000ML PPV (MISCELLANEOUS) ×3 IMPLANT
CARTRIDGE OIL MAESTRO DRILL (MISCELLANEOUS) ×1 IMPLANT
CLOSURE WOUND 1/2 X4 (GAUZE/BANDAGES/DRESSINGS) ×1
COVER WAND RF STERILE (DRAPES) ×3 IMPLANT
DIFFUSER DRILL AIR PNEUMATIC (MISCELLANEOUS) ×3 IMPLANT
DRAPE C-ARM 42X72 X-RAY (DRAPES) ×6 IMPLANT
DRAPE LAPAROTOMY 100X72 PEDS (DRAPES) ×3 IMPLANT
DRAPE MICROSCOPE LEICA (MISCELLANEOUS) ×3 IMPLANT
DURAPREP 6ML APPLICATOR 50/CS (WOUND CARE) ×3 IMPLANT
ELECT COATED BLADE 2.86 ST (ELECTRODE) ×3 IMPLANT
ELECT REM PT RETURN 9FT ADLT (ELECTROSURGICAL) ×3
ELECTRODE REM PT RTRN 9FT ADLT (ELECTROSURGICAL) ×1 IMPLANT
GAUZE 4X4 16PLY RFD (DISPOSABLE) IMPLANT
GAUZE SPONGE 4X4 12PLY STRL (GAUZE/BANDAGES/DRESSINGS) ×3 IMPLANT
GLOVE ECLIPSE 9.0 STRL (GLOVE) ×3 IMPLANT
GLOVE EXAM NITRILE XL STR (GLOVE) IMPLANT
GOWN STRL REUS W/ TWL LRG LVL3 (GOWN DISPOSABLE) IMPLANT
GOWN STRL REUS W/ TWL XL LVL3 (GOWN DISPOSABLE) IMPLANT
GOWN STRL REUS W/TWL 2XL LVL3 (GOWN DISPOSABLE) IMPLANT
GOWN STRL REUS W/TWL LRG LVL3 (GOWN DISPOSABLE)
GOWN STRL REUS W/TWL XL LVL3 (GOWN DISPOSABLE)
HALTER HD/CHIN CERV TRACTION D (MISCELLANEOUS) ×3 IMPLANT
HEMOSTAT POWDER KIT SURGIFOAM (HEMOSTASIS) IMPLANT
KIT BASIN OR (CUSTOM PROCEDURE TRAY) ×3 IMPLANT
KIT TURNOVER KIT B (KITS) ×3 IMPLANT
NEEDLE SPNL 20GX3.5 QUINCKE YW (NEEDLE) ×3 IMPLANT
NS IRRIG 1000ML POUR BTL (IV SOLUTION) ×3 IMPLANT
OIL CARTRIDGE MAESTRO DRILL (MISCELLANEOUS) ×3
PACK LAMINECTOMY NEURO (CUSTOM PROCEDURE TRAY) ×3 IMPLANT
PAD ARMBOARD 7.5X6 YLW CONV (MISCELLANEOUS) ×9 IMPLANT
PLATE 3 57.5XLCK NS SPNE CVD (Plate) ×1 IMPLANT
PLATE 3 ATLANTIS TRANS (Plate) ×2 IMPLANT
RUBBERBAND STERILE (MISCELLANEOUS) ×6 IMPLANT
SCREW ST FIX 4 ATL 3120213 (Screw) ×24 IMPLANT
SPACER SPNL 11X14X6XPEEK CVD (Cage) ×1 IMPLANT
SPACER SPNL 11X14X7XPEEK CVD (Cage) ×1 IMPLANT
SPCR SPNL 11X14X6XPEEK CVD (Cage) ×1 IMPLANT
SPCR SPNL 11X14X7XPEEK CVD (Cage) ×1 IMPLANT
SPONGE INTESTINAL PEANUT (DISPOSABLE) ×3 IMPLANT
SPONGE SURGIFOAM ABS GEL 100 (HEMOSTASIS) ×3 IMPLANT
STRIP CLOSURE SKIN 1/2X4 (GAUZE/BANDAGES/DRESSINGS) ×2 IMPLANT
SUT VIC AB 3-0 SH 8-18 (SUTURE) ×3 IMPLANT
SUT VIC AB 4-0 RB1 18 (SUTURE) ×3 IMPLANT
TAPE CLOTH 4X10 WHT NS (GAUZE/BANDAGES/DRESSINGS) ×3 IMPLANT
TAPE CLOTH SURG 4X10 WHT LF (GAUZE/BANDAGES/DRESSINGS) ×3 IMPLANT
TOWEL GREEN STERILE (TOWEL DISPOSABLE) ×3 IMPLANT
TOWEL GREEN STERILE FF (TOWEL DISPOSABLE) ×3 IMPLANT
TRAP SPECIMEN MUCOUS 40CC (MISCELLANEOUS) ×3 IMPLANT
WATER STERILE IRR 1000ML POUR (IV SOLUTION) ×3 IMPLANT

## 2018-09-26 NOTE — Plan of Care (Signed)
  Problem: Education: Goal: Ability to verbalize activity precautions or restrictions will improve Outcome: Progressing Goal: Knowledge of the prescribed therapeutic regimen will improve Outcome: Progressing Goal: Understanding of discharge needs will improve Outcome: Progressing   Problem: Activity: Goal: Ability to avoid complications of mobility impairment will improve Outcome: Progressing Goal: Ability to tolerate increased activity will improve Outcome: Progressing Goal: Will remain free from falls Outcome: Progressing   Problem: Pain Management: Goal: Pain level will decrease Outcome: Progressing   Problem: Bladder/Genitourinary: Goal: Urinary functional status for postoperative course will improve Outcome: Progressing   

## 2018-09-26 NOTE — Anesthesia Procedure Notes (Signed)
Procedure Name: Intubation Performed by: Ezekiel InaBotts, Cobey Raineri H, CRNA Pre-anesthesia Checklist: Patient identified, Emergency Drugs available, Suction available and Patient being monitored Patient Re-evaluated:Patient Re-evaluated prior to induction Oxygen Delivery Method: Circle System Utilized Preoxygenation: Pre-oxygenation with 100% oxygen Induction Type: IV induction Ventilation: Mask ventilation without difficulty Laryngoscope Size: Glidescope and 3 Grade View: Grade I Tube type: Oral Number of attempts: 1 Airway Equipment and Method: Stylet and Video-laryngoscopy Placement Confirmation: ETT inserted through vocal cords under direct vision,  positive ETCO2 and breath sounds checked- equal and bilateral Secured at: 22 cm Tube secured with: Tape Dental Injury: Teeth and Oropharynx as per pre-operative assessment

## 2018-09-26 NOTE — Anesthesia Postprocedure Evaluation (Signed)
Anesthesia Post Note  Patient: Felicia Ponce  Procedure(s) Performed: Anterior Cervical Discectomy Fusion Cervical Four-Cervical Five - Cervical Five-Cervical Six - Cervical Six-Cervical Seven (N/A )     Patient location during evaluation: PACU Anesthesia Type: General Level of consciousness: awake and alert Pain management: pain level controlled Vital Signs Assessment: post-procedure vital signs reviewed and stable Respiratory status: spontaneous breathing, nonlabored ventilation, respiratory function stable and patient connected to nasal cannula oxygen Cardiovascular status: blood pressure returned to baseline and stable Postop Assessment: no apparent nausea or vomiting Anesthetic complications: no    Last Vitals:  Vitals:   09/26/18 1600 09/26/18 1628  BP: 132/74 (!) 147/73  Pulse: 77 83  Resp: 15 18  Temp: 36.6 C 36.6 C  SpO2: 93% 94%    Last Pain:  Vitals:   09/26/18 1628  TempSrc: Oral  PainSc:                  [name]

## 2018-09-26 NOTE — Progress Notes (Signed)
Orthopedic Tech Progress Note Patient Details:  Felicia Ponce 1956-02-24 [id number]  Ortho Devices Type of Ortho Device: Soft collar Ortho Device/Splint Location: rn assisted. Ortho Device/Splint Interventions: Ordered, Application, Adjustment   Post Interventions Patient Tolerated: Well Instructions Provided: Care of device, Adjustment of device   Trinna PostMartinez, Erianna Jolly J 09/26/2018, 3:41 PM

## 2018-09-26 NOTE — Transfer of Care (Signed)
Immediate Anesthesia Transfer of Care Note  Patient: Felicia Ponce  Procedure(s) Performed: Anterior Cervical Discectomy Fusion Cervical Four-Cervical Five - Cervical Five-Cervical Six - Cervical Six-Cervical Seven (N/A )  Patient Location: PACU  Anesthesia Type:General  Level of Consciousness: awake, alert  and oriented  Airway & Oxygen Therapy: Patient Spontanous Breathing and Patient connected to face mask oxygen  Post-op Assessment: Report given to RN and Post -op Vital signs reviewed and stable  Post vital signs: Reviewed and stable  Last Vitals:  Vitals Value Taken Time  BP 142/71 09/26/2018  2:56 PM  Temp    Pulse 73 09/26/2018  2:59 PM  Resp 16 09/26/2018  2:59 PM  SpO2 90 % 09/26/2018  2:59 PM  Vitals shown include unvalidated device data.  Last Pain:  Vitals:   09/26/18 1009  TempSrc:   PainSc: 4          Complications: No apparent anesthesia complications

## 2018-09-26 NOTE — Op Note (Signed)
Date of procedure: 09/26/2018  Date of dictation: Same  Service: Neurosurgery  Preoperative diagnosis: Cervical spondylosis with stenosis, C4-5, C5-6, C6-7  Postoperative diagnosis: Same  Procedure Name: C4-5, C5-6, C6-7 anterior cervical discectomy with interbody fusion utilizing interbody peek cages, locally harvested autograft, and anterior plate instrumentation  Surgeon:Curtiss Mahmood A.Doniqua Saxby, M.D.  Asst. Surgeon: Doran DurandBergman, NP  Anesthesia: General  Indication: 62 year old female with neck and bilateral upper extremity pain paresthesias and some weakness.  Work-up demonstrates evidence of marked disc generation with associated spondylosis and stenosis worse at C5-6 but also present at C4-5 and C6-7.  Patient presents now for 3 level anterior cervical decompression and fusion in hopes of improving her symptoms.  Operative note: After induction anesthesia, patient position supine with neck slightly extended and held in place a Holter traction.  Patient's anterior cervical region prepped draped sterilely.  Incision made overlying C5.  Dissection performed in the right.  Retractor placed.  Fluoroscopy used.  Levels confirmed.  Disc spaces at C4-5, C5-6 and C6-7 were all incised.  Discectomy was performed using various instruments down to level the posterior annulus.  Microscope was brought to field use throughout the remainder of the discectomy remaining aspects of annulus and osteophytes removed down to level the posterior logical limb.  Posterior logical is not elevated and resected in piecemeal fashion.  Underlying thecal sac was then identified.  Wide central decompression then performed undercutting the bodies of C4 and C5.  Decompression then proceeded with neural foramen.  Wide anterior foraminotomies were performed long course exiting C5 nerve roots bilaterally.  At this point a very thorough decompression of been achieved.  There was no evidence of injury to the thecal sac or nerve roots.  Procedure  then repeated at C5-6 and C6-7 again without complications.  Wound was then irrigated fanlike solution.  Gelfoam was placed topically for hemostasis then removed.  [employer] anatomic peek cages packed with locally harvested autograft were each impacted into all 3 interspaces and recessed slightly from the anterior cortical margin.  Medtronic Atlantis translational plate was then placed over the C4-C7 levels.  This attached under fluoroscopic guidance using 13 later fixed angle screws to each at all 4 levels.  All screws given final tightening found to be solid within bone.  Locking screws engaged in all levels.  Final images reveal good position of the cages and the hardware at the proper upper level with normal alignment spine.  Hemostasis was assured with bipolar trocar.  Wounds were closed in layers of Vicryl sutures.  Steri-Strips sterile dressing were applied.  No apparent complications.  Patient tolerated the procedure well and she returns to the recovery room postop.

## 2018-09-26 NOTE — Anesthesia Preprocedure Evaluation (Signed)
Anesthesia Evaluation  Patient identified by MRN, date of birth, ID band Patient awake    Reviewed: Allergy & Precautions, NPO status , Patient's Chart, lab work & pertinent test results, reviewed documented beta blocker date and time   Airway Mallampati: III  TM Distance: >3 FB Neck ROM: Full    Dental no notable dental hx. (+) Teeth Intact, Dental Advisory Given   Pulmonary neg pulmonary ROS, sleep apnea ,    Pulmonary exam normal breath sounds clear to auscultation       Cardiovascular hypertension, Pt. on home beta blockers and Pt. on medications + CAD  Normal cardiovascular exam Rhythm:Regular Rate:Normal  ECG: NSR, rate 72  Nuclear stress test 05/27/17 (care everywhere):  Exam within normal limits.  Echo 04/22/17 [facility]):  1. Normal LV size and systolic function. LVH. Diastolic dysfunction. 2. Mild mitral regurgitation. 3. Moderate tricuspid regurgitation. 4. Mild to moderate pulmonary hypertension.  Cardiac cath 01/16/13 (care everywhere):  Mild non-obstructive ASCAD (mid RCA 10%) Ejection Fraction: 60% Wall Motion: Normal      Neuro/Psych PSYCHIATRIC DISORDERS Depression TIA   GI/Hepatic Neg liver ROS, hiatal hernia, GERD  Medicated and Controlled,  Endo/Other  Hyperthyroidism   Renal/GU negative Renal ROS     Musculoskeletal  (+) Arthritis , Osteoarthritis,  Fibromyalgia -Spondylolisthesis   Abdominal (+) + obese,   Peds  Hematology negative hematology ROS (+)   Anesthesia Other Findings Adrenal insufficiency Endocrinologist is Santiago GladPaul Gulley, MD who cleared pt for surgery noting "in holding area before surgery she should be given 50 mg hydrocortisone IV and then 25 mg every 8 hours for 24 hours. On day following surgery if she is taking oral meds she can just resume her usual replacement steroids. If she remains acutely ill for example persistent nausea or  fever, then continue 25 mg hydrocortisone IV twice daily until her acute illness resolves and resume her baseline replacement"  Reproductive/Obstetrics                             Anesthesia Physical  Anesthesia Plan  ASA: III  Anesthesia Plan: General   Post-op Pain Management:    Induction: Intravenous  PONV Risk Score and Plan: 3 and Midazolam, Dexamethasone and Ondansetron  Airway Management Planned: Oral ETT  Additional Equipment:   Intra-op Plan:   Post-operative Plan: Extubation in OR  Informed Consent: I have reviewed the patients History and Physical, chart, labs and discussed the procedure including the risks, benefits and alternatives for the proposed anesthesia with the patient or authorized representative who has indicated his/her understanding and acceptance.   Dental advisory given  Plan Discussed with: CRNA, Anesthesiologist and Surgeon  Anesthesia Plan Comments: (  )       Anesthesia Quick Evaluation

## 2018-09-26 NOTE — H&P (Signed)
Felicia Ponce is an 62 y.o. female.   Chief Complaint: Neck pain HPI: 62 year old female with progressive neck pain with bilateral upper extremity numbness paresthesias and weakness.  Work-up demonstrates evidence of significant cervical spondylosis with central and foraminal stenosis.  Patient has failed conservative management presents now for anterior cervical decompression and fusion in hopes of improving her symptoms.  Past Medical History:  Diagnosis Date  . Adrenal insufficiency (HCC)   . Arthritis   . Cervical stenosis of spine   . Depression   . Fibromyalgia   . GERD (gastroesophageal reflux disease)   . History of hiatal hernia   . Hypertension   . Hyperthyroidism   . Hypoglycemia   . Myocardial infarction (HCC)    mild in 2011, is not required to see cardiology now  . Pneumonia   . 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 Sleep apnea   . Spondylolisthesis of lumbar region   . TIA (transient ischemic attack)     Past Surgical History:  Procedure Laterality Date  . ABDOMINAL HYSTERECTOMY    . APPENDECTOMY    . CARDIAC CATHETERIZATION    . CHOLECYSTECTOMY    . COLONOSCOPY    . DILATION AND CURETTAGE OF UTERUS    . OOPHORECTOMY     Bilateral  . OVARIAN CYST REMOVAL    . TONSILLECTOMY      History reviewed. No pertinent family history. Social History:  reports that she has never smoked. She has never used smokeless tobacco. She reports that she does not drink alcohol or use drugs.  Allergies:  Allergies  Allergen Reactions  . Penicillins Rash and Other (See Comments)    PATIENT HAS HAD A PCN REACTION WITH IMMEDIATE RASH, FACIAL/TONGUE/THROAT SWELLING, SOB, OR LIGHTHEADEDNESS WITH HYPOTENSION:  #  #  #  YES  #  #  #   HAS PT DEVELOPED SEVERE RASH INVOLVING MUCUS MEMBRANES or SKIN NECROSIS: #  #  #  YES  #  #  #   Has patient had a PCN reaction that required hospitalization: No Has patient had a PCN reaction occurring within the last 10 years:  No    . Ciprofloxacin Nausea Only, Rash and Other (See Comments)    DIZZINESS  . Milnacipran Anxiety, Rash and Other (See Comments)    DIZZINESS, WEAKNESS  . Nsaids Rash and Other (See Comments)    ULCERS   . Lactase Other (See Comments)    UNSPECIFIED REACTION   . Other Other (See Comments)    Chlorine water Acidy Food causes GI upset NO Soy, wheat, gluten, corn products  . Savella [Milnacipran Hcl] Other (See Comments)    UNSPECIFIED REACTION   . Aspirin Rash  . Latex Rash  . Pollen Extract Other (See Comments)    Stuffy nose  . Sulfa Antibiotics Rash  . Sulfamethoxazole-Trimethoprim Rash  . Sulfasalazine Rash  . Tape Rash and Other (See Comments)    Can use paper tape  . Tolmetin Rash    Medications Prior to Admission  Medication Sig Dispense Refill  . CALCIUM-MAGNESIUM PO Take 1 tablet by mouth daily.    Marland Kitchen dexamethasone (DECADRON) 0.75 MG tablet Take 0.75 mg by mouth daily.    . diazepam (VALIUM) 5 MG tablet Take 1-2 tablets (5-10 mg total) by mouth every 6 (six) hours as needed for muscle spasms. (Patient taking differently: Take 5 mg by mouth every 6 (six) hours as needed for muscle spasms. ) 50 tablet  0  . doxycycline (VIBRA-TABS) 100 MG tablet Take 100 mg by mouth 2 (two) times daily.    . ergocalciferol (VITAMIN D2) 1.25 MG (50000 UT) capsule Take 50,000 Units by mouth every Tuesday.    . escitalopram (LEXAPRO) 10 MG tablet Take 10 mg by mouth daily.     Marland Kitchen. esomeprazole (NEXIUM) 20 MG capsule Take 20 mg by mouth daily.    Marland Kitchen. gabapentin (NEURONTIN) 300 MG capsule Take 300 mg by mouth 2 (two) times daily.   4  . Melatonin 5 MG CAPS Take 10 mg by mouth at bedtime.     . metoprolol succinate (TOPROL-XL) 25 MG 24 hr tablet Take 25 mg by mouth at bedtime.   1  . Omega-3 Fatty Acids (FISH OIL) 1000 MG CAPS Take 1,000 mg by mouth daily.    . orphenadrine (NORFLEX) 100 MG tablet Take 100 mg by mouth 2 (two) times daily as needed for muscle spasms.     Marland Kitchen.  oxyCODONE-acetaminophen (PERCOCET/ROXICET) 5-325 MG tablet Take 1 tablet by mouth every 6 (six) hours as needed for severe pain.  0  . Probiotic Product (PROBIOTIC PO) Take 1 capsule by mouth daily.    . promethazine (PHENERGAN) 25 MG tablet Take 25 mg by mouth every 6 (six) hours as needed for nausea or vomiting.    . RESTASIS 0.05 % ophthalmic emulsion Place 1 drop into both eyes 2 (two) times daily.  11  . traZODone (DESYREL) 100 MG tablet Take 200 mg by mouth at bedtime.    Marland Kitchen. albuterol (PROVENTIL HFA;VENTOLIN HFA) 108 (90 Base) MCG/ACT inhaler Inhale 1-2 puffs into the lungs every 6 (six) hours as needed for wheezing or shortness of breath.      Results for orders placed or performed during the hospital encounter of 09/26/18 (from the past 48 hour(s))  Glucose, capillary     Status: None   Collection Time: 09/26/18  9:28 AM  Result Value Ref Range   Glucose-Capillary 92 70 - 99 mg/dL   No results found.  Pertinent items noted in HPI and remainder of comprehensive ROS otherwise negative.  Blood pressure (!) 141/68, pulse 67, temperature 98.7 F (37.1 C), temperature source Oral, resp. rate 18, height 5\' 6"  (1.676 m), weight 94.3 kg, SpO2 95 %.  Patient is awake and alert.  She is oriented and appropriate.  Speech is fluent.  Judgment insight are intact.  Cranial nerve function normal bilateral.  Motor examination of the extremities reveals some mild weakness of grips and intrinsics bilaterally otherwise motor strength intact.  Sensory examination is normal aside from some patchy distal sensory loss in both upper extremities.  Reflexes are brisk.  No evidence of long track signs.  Gait is mildly spastic.  Examination head ears eyes and throat is unremarkable her chest and abdomen are benign.  Extremities are free from injury or deformity. Assessment/Plan Cervical spondylosis with stenosis.  Plan C4-5, C5-6 and C6-7 anterior cervical discectomy and interbody fusion utilizing interbody peek  cages, locally harvested autograft, and anterior plate instrumentation.  Risks and benefits of been explained.  Patient wishes to proceed.  Sherilyn CooterHenry A Malli Falotico 09/26/2018, 11:41 AM

## 2018-09-26 NOTE — Brief Op Note (Signed)
09/26/2018  2:27 PM  PATIENT:  Felicia Ponce  62 y.o. female  PRE-OPERATIVE DIAGNOSIS:  Stenosis  POST-OPERATIVE DIAGNOSIS:  Stenosis  PROCEDURE:  Procedure(s) with comments: Anterior Cervical Discectomy Fusion Cervical Four-Cervical Five - Cervical Five-Cervical Six - Cervical Six-Cervical Seven (N/A) - Anterior Cervical Discectomy Fusion Cervical Four-Cervical Five - Cervical Five-Cervical Six - Cervical Six-Cervical Seven  SURGEON:  Surgeon(s) and Role:    * Julio SicksPool, Doria Fern, MD - Primary  PHYSICIAN ASSISTANT:   ASSISTANTSDoran Durand: Bergman, NP   ANESTHESIA:   general  EBL:  250 mL   BLOOD ADMINISTERED:none  DRAINS: none   LOCAL MEDICATIONS USED:  NONE  SPECIMEN:  No Specimen  DISPOSITION OF SPECIMEN:  N/A  COUNTS:  YES  TOURNIQUET:  * No tourniquets in log *  DICTATION: .Dragon Dictation  PLAN OF CARE: Admit to inpatient   PATIENT DISPOSITION:  PACU - hemodynamically stable.   Delay start of Pharmacological VTE agent (>24hrs) due to surgical blood loss or risk of bleeding: yes

## 2018-09-27 ENCOUNTER — Encounter (HOSPITAL_COMMUNITY): Payer: Self-pay | Admitting: Neurosurgery

## 2018-09-27 MED ORDER — CYCLOBENZAPRINE HCL 10 MG PO TABS: 10.0000 mg | ORAL_TABLET | Freq: Three times a day (TID) | ORAL | 0 refills | Status: DC | PRN

## 2018-09-27 MED ORDER — HYDROCODONE-ACETAMINOPHEN 10-325 MG PO TABS: 1.0000 | ORAL_TABLET | ORAL | 0 refills | Status: DC | PRN

## 2018-09-27 NOTE — Evaluation (Addendum)
Occupational Therapy Evaluation and Discharge  Patient Details Name: Felicia Ponce MRN: [id number] DOB: 1956-08-06 Today's Date: 09/27/2018    History of Present Illness Pt is a 62 y/o female s/p C4-5, C5-6, C6-7 anterior cervical discectomy with interbody fusion due to progressive neck pain with B UE numbness and weakness. Past medical history: Arthritis, Cervical stenosis of spine, Depression, Fibromyalgia, Hypertension, Myocardial infarction (HCC), Pneumonia, Sleep apnea, Spondylolisthesis of lumbar region, and TIA (transient ischemic attack).   Clinical Impression   PTA patient independent.  Currently admitted for above and limited problem list below.  Educated on safety, precautions, brace wear schedule, ADL compensatory techniques, safety, body mechanics and mobility. Patient able to complete self care tasks, functional mobility and transfers at supervision level at this time, demonstrating good safety and precaution adherence with minimal cueing.  Patient will have support of spouse as needed.  At this time, all education has been completed and no further OT needs have been identified and OT is signing off. Thank you for this referral.       Follow Up Recommendations  No OT follow up;Supervision - Intermittent    Equipment Recommendations  3 in 1 bedside commode    Recommendations for Other Services       Precautions / Restrictions Precautions Precautions: Cervical Precaution Booklet Issued: Yes (comment) Precaution Comments: reviewed with patient and spouse  Required Braces or Orthoses: Cervical Brace Cervical Brace: Soft collar;At all times Restrictions Weight Bearing Restrictions: No      Mobility Bed Mobility               General bed mobility comments: seated EOB upon entry, verbally reviewed log roll technique  Transfers Overall transfer level: Needs assistance Equipment used: None Transfers: Sit to/from Stand Sit to Stand: Supervision          General transfer comment: supervision for safety, good posture and body mechanics    Balance Overall balance assessment: Mild deficits observed, not formally tested                                         ADL either performed or assessed with clinical judgement   ADL Overall ADL's : Needs assistance/impaired     Grooming: Supervision/safety;Standing Grooming Details (indicate cue type and reason): supervision for safety and precaution adherance  Upper Body Bathing: Set up;Sitting   Lower Body Bathing: Supervison/ safety;Cueing for compensatory techniques;Adhering to back precautions;Sit to/from stand   Upper Body Dressing : Supervision/safety;Sitting   Lower Body Dressing: Supervision/safety;Set up;Adhering to back precautions;Cueing for compensatory techniques;Sit to/from stand   Toilet Transfer: Supervision/safety;Ambulation;BSC;Regular Teacher, adult education Details (indicate cue type and reason): 3:1 over toilet  Toileting- Clothing Manipulation and Hygiene: Supervision/safety;Sit to/from stand;Cueing for back precautions;Cueing for compensatory techniques Toileting - Clothing Manipulation Details (indicate cue type and reason): cueing to adhere to precautions  Tub/ Shower Transfer: Walk-in shower;Cueing for safety;Supervision/safety;Ambulation;3 in 1 Tub/Shower Transfer Details (indicate cue type and reason): simulated  Functional mobility during ADLs: Supervision/safety General ADL Comments: Patient educated on precautions, moblity, safety and ADL compensatory techniques.      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 but vision hasn't changed     Perception     Praxis      Pertinent Vitals/Pain Pain Assessment: 0-10 Pain Score:  6  Pain Location: neck Pain Descriptors /  Indicators: Discomfort;Grimacing;Operative site guarding Pain Intervention(s): Monitored during session     Hand Dominance Right   Extremity/Trunk Assessment Upper Extremity Assessment Upper Extremity Assessment: Generalized weakness(within cervical precautions, sensation intact )   Lower Extremity Assessment Lower Extremity Assessment: Overall WFL for tasks assessed   Cervical / Trunk Assessment Cervical / Trunk Assessment: Other exceptions Cervical / Trunk Exceptions: s/p cervical sx   Communication Communication Communication: Prefers language other than English   Cognition Arousal/Alertness: Awake/alert Behavior During Therapy: WFL for tasks assessed/performed Overall Cognitive Status: Within Functional Limits for tasks assessed                                     General Comments  spouse present and supportive    Exercises     Shoulder Instructions      Home Living Family/patient expects to be discharged to:: Private residence Living Arrangements: Spouse/significant other Available Help at Discharge: Family;Available 24 hours/day Type of Home: Mobile home Home Access: Stairs to enter Entrance Stairs-Number of Steps: 1   Home Layout: One level     Bathroom Shower/Tub: Producer, television/film/videoWalk-in shower   Bathroom Toilet: Standard     Home Equipment: Environmental consultantWalker - 2 wheels;Cane - single point;Bedside commode          Prior Functioning/Environment Level of Independence: Independent        Comments: independent with mobility and light IADLs        OT Problem List: Decreased activity tolerance;Decreased knowledge of use of DME or AE;Decreased knowledge of precautions;Pain;Impaired balance (sitting and/or standing);Decreased strength      OT Treatment/Interventions:      OT Goals(Current goals can be found in the care plan section) Acute Rehab OT Goals Patient Stated Goal: home today OT Goal Formulation: With patient  OT Frequency:     Barriers to D/C:             Co-evaluation              AM-PAC OT "6 Clicks" Daily Activity     Outcome Measure Help from another person eating meals?: None Help from another person taking care of personal grooming?: None Help from another person toileting, which includes using toliet, bedpan, or urinal?: None Help from another person bathing (including washing, rinsing, drying)?: None Help from another person to put on and taking off regular upper body clothing?: None Help from another person to put on and taking off regular lower body clothing?: None 6 Click Score: 24   End of Session Equipment Utilized During Treatment: Cervical collar Nurse Communication: Mobility status  Activity Tolerance: Patient tolerated treatment well Patient left: with call bell/phone within reach;with family/visitor present;Other (comment)(seated EOB)  OT Visit Diagnosis: Pain Pain - part of body: (neck)                Time: [id number] OT Time Calculation (min): 19 min Charges:  OT General Charges $OT Visit: 1 Visit OT Evaluation $OT Eval Low Complexity: 1 Low  Chancy Milroyhristie S Kalanie Fewell, OT Acute Rehabilitation Services Pager [phone] Office [phone]   Chancy MilroyChristie S Kitt Minardi 09/27/2018, 8:11 AM

## 2018-09-27 NOTE — Progress Notes (Signed)
Patient is discharged from room 3C05 at this time. Alert and in stable condition. IV site d/c'd and instructions read to patient and spouse with understanding verbalized. Left unit via wheelchair with all belongings at side. 

## 2018-09-27 NOTE — Discharge Summary (Signed)
Physician Discharge Summary  Patient ID: Felicia Ponce MRN: [id number] DOB/AGE: [birth year] 62 y.o.  Admit date: 09/26/2018 Discharge date: 09/27/2018  Admission Diagnoses: Cervical spondylosis with stenosis  Discharge Diagnoses:  Active Problems:   Cervical radiculopathy   Discharged Condition: good  Hospital Course: Patient underwent C4-5, C5-6, C6-7 anterior cervical discectomy and interbody fusion utilizing interbody peek cages, localized autograft, and anterior plate instrumentation without complication. She has done well post-operatively and is ready to be discharged home.  Consults: None  Significant Diagnostic Studies:  radiology: Two intraoperative fluoroscopic images of the cervical spine are provided. ACD has been performed from C4-C7 with placement of an anterior fusion plate, screws, and interbody implants. Endotracheal and enteric tubes are partially visualized.  Treatments: surgery:  C4-5, C5-6, C6-7 anterior cervical discectomy and interbody fusion utilizing interbody peek cages ([employer])  Discharge Exam: Blood pressure 130/71, pulse 68, temperature 98.7 F (37.1 C), temperature source Oral, resp. rate 18, height 5\' 6"  (1.676 m), weight 94.3 kg, SpO2 93 %.  Alert and oriented x 4 MAE, Strength 5/5 Neuropathy improved from pre-op Some issues with incontinence post-operatively, encouraged patient to follow up with her existing urologist if not improved in the next two days   Disposition: Discharge disposition: 01-Home or Self Care        Allergies as of 09/27/2018      Reactions   Penicillins Rash, Other (See Comments)   PATIENT HAS HAD A PCN REACTION WITH IMMEDIATE RASH, FACIAL/TONGUE/THROAT SWELLING, SOB, OR LIGHTHEADEDNESS WITH HYPOTENSION:  #  #  #  YES  #  #  #   HAS PT DEVELOPED SEVERE RASH INVOLVING MUCUS MEMBRANES or SKIN NECROSIS: #  #  #  YES  #  #  #   Has patient had a PCN reaction that required hospitalization: No Has patient had a PCN  reaction occurring within the last 10 years: No   Ciprofloxacin Nausea Only, Rash, Other (See Comments)   DIZZINESS   Milnacipran Anxiety, Rash, Other (See Comments)   DIZZINESS, WEAKNESS   Nsaids Rash, Other (See Comments)   ULCERS   Lactase Other (See Comments)   UNSPECIFIED REACTION    Other Other (See Comments)   Chlorine water Acidy Food causes GI upset NO Soy, wheat, gluten, corn products   Savella [milnacipran Hcl] Other (See Comments)   UNSPECIFIED REACTION    Aspirin Rash   Latex Rash   Pollen Extract Other (See Comments)   Stuffy nose   Sulfa Antibiotics Rash   Sulfamethoxazole-trimethoprim Rash   Sulfasalazine Rash   Tape Rash, Other (See Comments)   Can use paper tape   Tolmetin Rash      Medication List    TAKE these medications   albuterol 108 (90 Base) MCG/ACT inhaler Commonly known as:  PROVENTIL HFA;VENTOLIN HFA Inhale 1-2 puffs into the lungs every 6 (six) hours as needed for wheezing or shortness of breath.   CALCIUM-MAGNESIUM PO Take 1 tablet by mouth daily.   cyclobenzaprine 10 MG tablet Commonly known as:  FLEXERIL Take 1 tablet (10 mg total) by mouth 3 (three) times daily as needed for muscle spasms.   dexamethasone 0.75 MG tablet Commonly known as:  DECADRON Take 0.75 mg by mouth daily.   diazepam 5 MG tablet Commonly known as:  VALIUM Take 1-2 tablets (5-10 mg total) by mouth every 6 (six) hours as needed for muscle spasms. What changed:  how much to take   doxycycline 100 MG tablet Commonly known as:  VIBRA-TABS  Take 100 mg by mouth 2 (two) times daily.   ergocalciferol 1.25 MG (50000 UT) capsule Commonly known as:  VITAMIN D2 Take 50,000 Units by mouth every Tuesday.   escitalopram 10 MG tablet Commonly known as:  LEXAPRO Take 10 mg by mouth daily.   esomeprazole 20 MG capsule Commonly known as:  NEXIUM Take 20 mg by mouth daily.   Fish Oil 1000 MG Caps Take 1,000 mg by mouth daily.   gabapentin 300 MG capsule Commonly  known as:  NEURONTIN Take 300 mg by mouth 2 (two) times daily.   HYDROcodone-acetaminophen 10-325 MG tablet Commonly known as:  NORCO Take 1 tablet by mouth every 4 (four) hours as needed for severe pain ((score 7 to 10)).   Melatonin 5 MG Caps Take 10 mg by mouth at bedtime.   metoprolol succinate 25 MG 24 hr tablet Commonly known as:  TOPROL-XL Take 25 mg by mouth at bedtime.   orphenadrine 100 MG tablet Commonly known as:  NORFLEX Take 100 mg by mouth 2 (two) times daily as needed for muscle spasms.   oxyCODONE-acetaminophen 5-325 MG tablet Commonly known as:  PERCOCET/ROXICET Take 1 tablet by mouth every 6 (six) hours as needed for severe pain.   PROBIOTIC PO Take 1 capsule by mouth daily.   promethazine 25 MG tablet Commonly known as:  PHENERGAN Take 25 mg by mouth every 6 (six) hours as needed for nausea or vomiting.   RESTASIS 0.05 % ophthalmic emulsion Generic drug:  cycloSPORINE Place 1 drop into both eyes 2 (two) times daily.   traZODone 100 MG tablet Commonly known as:  DESYREL Take 200 mg by mouth at bedtime.        Signed: Floreen ComberMeghan D Andreas Sobolewski 09/27/2018, 9:58 AM

## 2018-09-27 NOTE — Discharge Instructions (Signed)

## 2018-10-01 ENCOUNTER — Encounter (HOSPITAL_COMMUNITY): Payer: Self-pay | Admitting: Emergency Medicine

## 2018-10-01 ENCOUNTER — Emergency Department: Payer: Self-pay

## 2018-10-01 ENCOUNTER — Other Ambulatory Visit: Payer: Self-pay

## 2018-10-01 ENCOUNTER — Inpatient Hospital Stay (HOSPITAL_COMMUNITY)
Admission: EM | Admit: 2018-10-01 | Discharge: 2018-10-04 | DRG: 074 | Disposition: A | Discharge status: Home / Self Care | Payer: Medicare HMO | Attending: Neurosurgery | Admitting: Neurosurgery

## 2018-10-01 DIAGNOSIS — Z7952 Long term (current) use of systemic steroids: Secondary | ICD-10-CM

## 2018-10-01 DIAGNOSIS — Z79890 Hormone replacement therapy (postmenopausal): Secondary | ICD-10-CM

## 2018-10-01 DIAGNOSIS — Z91048 Other nonmedicinal substance allergy status: Secondary | ICD-10-CM

## 2018-10-01 DIAGNOSIS — Z888 Allergy status to other drugs, medicaments and biological substances status: Secondary | ICD-10-CM

## 2018-10-01 DIAGNOSIS — R1310 Dysphagia, unspecified: Secondary | ICD-10-CM | POA: Diagnosis not present

## 2018-10-01 DIAGNOSIS — K219 Gastro-esophageal reflux disease without esophagitis: Secondary | ICD-10-CM | POA: Diagnosis not present

## 2018-10-01 DIAGNOSIS — Z9889 Other specified postprocedural states: Secondary | ICD-10-CM

## 2018-10-01 DIAGNOSIS — Z8701 Personal history of pneumonia (recurrent): Secondary | ICD-10-CM

## 2018-10-01 DIAGNOSIS — G4730 Sleep apnea, unspecified: Secondary | ICD-10-CM | POA: Diagnosis present

## 2018-10-01 DIAGNOSIS — R0600 Dyspnea, unspecified: Secondary | ICD-10-CM | POA: Diagnosis present

## 2018-10-01 DIAGNOSIS — Z881 Allergy status to other antibiotic agents status: Secondary | ICD-10-CM | POA: Diagnosis not present

## 2018-10-01 DIAGNOSIS — M792 Neuralgia and neuritis, unspecified: Secondary | ICD-10-CM

## 2018-10-01 DIAGNOSIS — E0500 Thyrotoxicosis with diffuse goiter without thyrotoxic crisis or storm: Secondary | ICD-10-CM | POA: Diagnosis not present

## 2018-10-01 DIAGNOSIS — Z880 Allergy status to penicillin: Secondary | ICD-10-CM | POA: Diagnosis not present

## 2018-10-01 DIAGNOSIS — Z882 Allergy status to sulfonamides status: Secondary | ICD-10-CM

## 2018-10-01 DIAGNOSIS — E271 Primary adrenocortical insufficiency: Secondary | ICD-10-CM | POA: Diagnosis present

## 2018-10-01 DIAGNOSIS — Z9049 Acquired absence of other specified parts of digestive tract: Secondary | ICD-10-CM

## 2018-10-01 DIAGNOSIS — I10 Essential (primary) hypertension: Secondary | ICD-10-CM | POA: Diagnosis not present

## 2018-10-01 DIAGNOSIS — Z90710 Acquired absence of both cervix and uterus: Secondary | ICD-10-CM

## 2018-10-01 DIAGNOSIS — M1990 Unspecified osteoarthritis, unspecified site: Secondary | ICD-10-CM | POA: Diagnosis present

## 2018-10-01 DIAGNOSIS — R52 Pain, unspecified: Secondary | ICD-10-CM

## 2018-10-01 DIAGNOSIS — I252 Old myocardial infarction: Secondary | ICD-10-CM

## 2018-10-01 DIAGNOSIS — Z8673 Personal history of transient ischemic attack (TIA), and cerebral infarction without residual deficits: Secondary | ICD-10-CM | POA: Diagnosis not present

## 2018-10-01 DIAGNOSIS — M5410 Radiculopathy, site unspecified: Secondary | ICD-10-CM | POA: Diagnosis not present

## 2018-10-01 DIAGNOSIS — M797 Fibromyalgia: Secondary | ICD-10-CM | POA: Diagnosis present

## 2018-10-01 DIAGNOSIS — Z90722 Acquired absence of ovaries, bilateral: Secondary | ICD-10-CM

## 2018-10-01 DIAGNOSIS — Z981 Arthrodesis status: Secondary | ICD-10-CM

## 2018-10-01 DIAGNOSIS — Z79899 Other long term (current) drug therapy: Secondary | ICD-10-CM

## 2018-10-01 DIAGNOSIS — Z91040 Latex allergy status: Secondary | ICD-10-CM

## 2018-10-01 MED ORDER — OXYCODONE HCL 5 MG PO TABS
5.0000 mg | ORAL_TABLET | ORAL | Status: DC | PRN
  Administered 2018-10-02 – 2018-10-04 (×9): 5 mg via ORAL
  Filled 2018-10-01 (×10): qty 1

## 2018-10-01 MED ORDER — ACETAMINOPHEN 325 MG PO TABS
650.0000 mg | ORAL_TABLET | Freq: Four times a day (QID) | ORAL | Status: DC | PRN
  Administered 2018-10-04: 650 mg via ORAL
  Filled 2018-10-01: qty 2

## 2018-10-01 MED ORDER — GABAPENTIN 300 MG PO CAPS
300.0000 mg | ORAL_CAPSULE | Freq: Two times a day (BID) | ORAL | Status: DC
  Administered 2018-10-01 – 2018-10-04 (×7): 300 mg via ORAL
  Filled 2018-10-01 (×7): qty 1

## 2018-10-01 MED ORDER — DOCUSATE SODIUM 100 MG PO CAPS
100.0000 mg | ORAL_CAPSULE | Freq: Two times a day (BID) | ORAL | Status: DC
  Administered 2018-10-01 – 2018-10-04 (×7): 100 mg via ORAL
  Filled 2018-10-01 (×7): qty 1

## 2018-10-01 MED ORDER — CYCLOSPORINE 0.05 % OP EMUL
1.0000 [drp] | Freq: Two times a day (BID) | OPHTHALMIC | Status: DC
  Administered 2018-10-01 – 2018-10-04 (×6): 1 [drp] via OPHTHALMIC
  Filled 2018-10-01 (×8): qty 1

## 2018-10-01 MED ORDER — CYCLOBENZAPRINE HCL 10 MG PO TABS
10.0000 mg | ORAL_TABLET | Freq: Three times a day (TID) | ORAL | Status: DC | PRN
  Administered 2018-10-01 – 2018-10-02 (×3): 10 mg via ORAL
  Filled 2018-10-01 (×4): qty 1

## 2018-10-01 MED ORDER — SODIUM CHLORIDE 0.9 % IV BOLUS: 1000.0000 mL | Freq: Once | INTRAVENOUS | Status: DC

## 2018-10-01 MED ORDER — SENNA 8.6 MG PO TABS
1.0000 | ORAL_TABLET | Freq: Two times a day (BID) | ORAL | Status: DC
  Administered 2018-10-01 – 2018-10-04 (×7): 8.6 mg via ORAL
  Filled 2018-10-01 (×7): qty 1

## 2018-10-01 MED ORDER — DEXAMETHASONE SODIUM PHOSPHATE 10 MG/ML IJ SOLN
6.0000 mg | Freq: Four times a day (QID) | INTRAMUSCULAR | Status: DC
  Administered 2018-10-01: 6 mg via INTRAVENOUS
  Filled 2018-10-01: qty 1

## 2018-10-01 MED ORDER — HYDROCODONE-ACETAMINOPHEN 5-325 MG PO TABS
1.0000 | ORAL_TABLET | ORAL | Status: DC | PRN
  Administered 2018-10-01 – 2018-10-02 (×4): 2 via ORAL
  Filled 2018-10-01 (×4): qty 2

## 2018-10-01 MED ORDER — ONDANSETRON HCL 4 MG/2ML IJ SOLN
4.0000 mg | Freq: Once | INTRAMUSCULAR | Status: AC
  Administered 2018-10-01: 4 mg via INTRAVENOUS
  Filled 2018-10-01: qty 2

## 2018-10-01 MED ORDER — DEXAMETHASONE SODIUM PHOSPHATE 10 MG/ML IJ SOLN
10.0000 mg | Freq: Four times a day (QID) | INTRAMUSCULAR | Status: DC
  Administered 2018-10-01 – 2018-10-04 (×13): 10 mg via INTRAVENOUS
  Filled 2018-10-01 (×13): qty 1

## 2018-10-01 MED ORDER — ACETAMINOPHEN 650 MG RE SUPP: 650.0000 mg | Freq: Four times a day (QID) | RECTAL | Status: DC | PRN

## 2018-10-01 MED ORDER — HYDROMORPHONE HCL 1 MG/ML IJ SOLN
0.5000 mg | Freq: Once | INTRAMUSCULAR | Status: AC
  Administered 2018-10-01: 0.5 mg via INTRAVENOUS
  Filled 2018-10-01: qty 1

## 2018-10-01 MED ORDER — PANTOPRAZOLE SODIUM 40 MG PO TBEC
40.0000 mg | DELAYED_RELEASE_TABLET | Freq: Two times a day (BID) | ORAL | Status: DC
  Administered 2018-10-01 – 2018-10-04 (×7): 40 mg via ORAL
  Filled 2018-10-01 (×7): qty 1

## 2018-10-01 MED ORDER — TRAZODONE HCL 100 MG PO TABS
200.0000 mg | ORAL_TABLET | Freq: Every day | ORAL | Status: DC
  Administered 2018-10-01 – 2018-10-03 (×3): 200 mg via ORAL
  Filled 2018-10-01 (×3): qty 2

## 2018-10-01 MED ORDER — ESCITALOPRAM OXALATE 10 MG PO TABS
10.0000 mg | ORAL_TABLET | Freq: Every day | ORAL | Status: DC
  Administered 2018-10-01 – 2018-10-04 (×4): 10 mg via ORAL
  Filled 2018-10-01 (×4): qty 1

## 2018-10-01 NOTE — ED Provider Notes (Signed)
[facility] EMERGENCY DEPARTMENT Provider Note   CSN: [id number] Arrival date & time: 10/01/18  0321     History   Chief Complaint No chief complaint on file.   HPI Trinetta Alemu is a 62 y.o. female.  62 yo F with a chief complaint of difficulty swallowing and difficulty breathing.  Going on for the past few days.  The patient had a cervical spine fusion about a week ago.  She denies fevers or chills.  Denies cough or congestion.  She went to an outside ED and they obtained CT imaging of her C-spine which was concerning for a fluid collection spanning C7-T1 in the soft tissues that had displaced the trachea and esophagus.  The case was discussed with the neurosurgeon on-call who recommended that she be transferred here.  The history is provided by the patient.  Illness  This is a new problem. The current episode started 6 to 12 hours ago. The problem occurs constantly. The problem has not changed since onset.Associated symptoms include shortness of breath. Pertinent negatives include no chest pain, no abdominal pain and no headaches. Nothing aggravates the symptoms. Nothing relieves the symptoms. She has tried nothing for the symptoms. The treatment provided no relief.    Past Medical History:  Diagnosis Date  . Adrenal insufficiency (HCC)   . Arthritis   . Cervical stenosis of spine   . Depression   . Fibromyalgia   . GERD (gastroesophageal reflux disease)   . History of hiatal hernia   . Hypertension   . Hyperthyroidism   . Hypoglycemia   . Myocardial infarction (HCC)    mild in 2011, is not required to see cardiology now  . Pneumonia   . 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 Sleep apnea   . Spondylolisthesis of lumbar region   . TIA (transient ischemic attack)     Patient Active Problem List   Diagnosis Date Noted  . Cervical radiculopathy 09/26/2018  . Degenerative spondylolisthesis 09/20/2017    Past Surgical History:    Procedure Laterality Date  . ABDOMINAL HYSTERECTOMY    . ANTERIOR CERVICAL DECOMP/DISCECTOMY FUSION N/A 09/26/2018   Procedure: Anterior Cervical Discectomy Fusion Cervical Four-Cervical Five - Cervical Five-Cervical Six - Cervical Six-Cervical Seven;  Surgeon: Julio Sicks, MD;  Location: [facility] OR;  Service: Neurosurgery;  Laterality: N/A;  Anterior Cervical Discectomy Fusion Cervical Four-Cervical Five - Cervical Five-Cervical Six - Cervical Six-Cervical Seven  . APPENDECTOMY    . CARDIAC CATHETERIZATION    . CHOLECYSTECTOMY    . COLONOSCOPY    . DILATION AND CURETTAGE OF UTERUS    . OOPHORECTOMY     Bilateral  . OVARIAN CYST REMOVAL    . TONSILLECTOMY       OB History   No obstetric history on file.      Home Medications    Prior to Admission medications   Medication Sig Start Date End Date Taking? Authorizing Provider  albuterol (PROVENTIL HFA;VENTOLIN HFA) 108 (90 Base) MCG/ACT inhaler Inhale 1-2 puffs into the lungs every 6 (six) hours as needed for wheezing or shortness of breath.    [provider]  CALCIUM-MAGNESIUM PO Take 1 tablet by mouth daily.    [provider]  cyclobenzaprine (FLEXERIL) 10 MG tablet Take 1 tablet (10 mg total) by mouth 3 (three) times daily as needed for muscle spasms. 09/27/18   Val Eagle D, NP  dexamethasone (DECADRON) 0.75 MG tablet Take 0.75 mg by  mouth daily.    [provider]  diazepam (VALIUM) 5 MG tablet Take 1-2 tablets (5-10 mg total) by mouth every 6 (six) hours as needed for muscle spasms. Patient taking differently: Take 5 mg by mouth every 6 (six) hours as needed for muscle spasms.  09/22/17   Julio SicksPool, Henry, MD  doxycycline (VIBRA-TABS) 100 MG tablet Take 100 mg by mouth 2 (two) times daily.    [provider]  ergocalciferol (VITAMIN D2) 1.25 MG (50000 UT) capsule Take 50,000 Units by mouth every Tuesday.    [provider]  escitalopram (LEXAPRO) 10 MG tablet Take 10 mg by mouth daily.      [provider]  esomeprazole (NEXIUM) 20 MG capsule Take 20 mg by mouth daily.    [provider]  gabapentin (NEURONTIN) 300 MG capsule Take 300 mg by mouth 2 (two) times daily.     [provider]  HYDROcodone-acetaminophen (NORCO) 10-325 MG tablet Take 1 tablet by mouth every 4 (four) hours as needed for severe pain ((score 7 to 10)). 09/27/18   Val EagleBergman, Meghan D, NP  Melatonin 5 MG CAPS Take 10 mg by mouth at bedtime.     [provider]  metoprolol succinate (TOPROL-XL) 25 MG 24 hr tablet Take 25 mg by mouth at bedtime.     [provider]  Omega-3 Fatty Acids (FISH OIL) 1000 MG CAPS Take 1,000 mg by mouth daily.    [provider]  orphenadrine (NORFLEX) 100 MG tablet Take 100 mg by mouth 2 (two) times daily as needed for muscle spasms.     [provider]  oxyCODONE-acetaminophen (PERCOCET/ROXICET) 5-325 MG tablet Take 1 tablet by mouth every 6 (six) hours as needed for severe pain. 08/10/18   [provider]  Probiotic Product (PROBIOTIC PO) Take 1 capsule by mouth daily.    [provider]  promethazine (PHENERGAN) 25 MG tablet Take 25 mg by mouth every 6 (six) hours as needed for nausea or vomiting.    [provider]  RESTASIS 0.05 % ophthalmic emulsion Place 1 drop into both eyes 2 (two) times daily. 06/01/17   [provider]  traZODone (DESYREL) 100 MG tablet Take 200 mg by mouth at bedtime.    [provider]    Family History No family history on file.  Social History Social History   Tobacco Use  . Smoking status: Never Smoker  . Smokeless tobacco: Never Used  Substance Use Topics  . Alcohol use: No    Frequency: Never  . Drug use: No     Allergies   Penicillins; Ciprofloxacin; Milnacipran; Nsaids; Lactase; Other; Savella [milnacipran hcl]; Aspirin; Latex; Pollen extract; Sulfa antibiotics; Sulfamethoxazole-trimethoprim; Sulfasalazine; Tape; and  Tolmetin   Review of Systems Review of Systems  Constitutional: Negative for chills and fever.  HENT: Negative for congestion and rhinorrhea.   Eyes: Negative for redness and visual disturbance.  Respiratory: Positive for shortness of breath. Negative for wheezing.   Cardiovascular: Negative for chest pain and palpitations.  Gastrointestinal: Negative for abdominal pain, nausea and vomiting.  Genitourinary: Negative for dysuria and urgency.  Musculoskeletal: Negative for arthralgias and myalgias.  Skin: Negative for pallor and wound.  Neurological: Negative for dizziness and headaches.     Physical Exam Updated Vital Signs There were no vitals taken for this visit.  Physical Exam Vitals signs and nursing note reviewed.  Constitutional:      General: She is not in acute distress.    Appearance: She  is well-developed. She is not diaphoretic.  HENT:     Head: Normocephalic and atraumatic.  Eyes:     Pupils: Pupils are equal, round, and reactive to light.  Neck:     Musculoskeletal: Normal range of motion and neck supple.  Cardiovascular:     Rate and Rhythm: Normal rate and regular rhythm.     Heart sounds: No murmur. No friction rub. No gallop.   Pulmonary:     Effort: Pulmonary effort is normal.     Breath sounds: No wheezing or rales.  Abdominal:     General: There is no distension.     Palpations: Abdomen is soft.     Tenderness: There is no abdominal tenderness.  Musculoskeletal:        General: No tenderness.     Comments: Pulse motor and sensation is intact to the right upper extremity.  No appreciable weakness to that arm.  Skin:    General: Skin is warm and dry.  Neurological:     Mental Status: She is alert and oriented to person, place, and time.  Psychiatric:        Behavior: Behavior normal.      ED Treatments / Results  Labs (all labs ordered are listed, but only abnormal results are displayed) Labs Reviewed - No data to  display  EKG None  Radiology No results found.  Procedures Procedures (including critical care time)  Medications Ordered in ED Medications - No data to display   Initial Impression / Assessment and Plan / ED Course  I have reviewed the triage vital signs and the nursing notes.  Pertinent labs & imaging results that were available during my care of the patient were reviewed by me and considered in my medical decision making (see chart for details).     62 yo F with a chief complaint of right-sided neck pain that radiates down the arm and difficulty breathing and swallowing.  Going on for the past few days.  Recently had a cervical spine fusion.  No appreciable weakness on my exam.  She has an outside CT with a postop fluid collection that has some mass-effect on the trachea and esophagus.  Maintaining her airway now without difficulty.  Will discuss the case with neurosurgery.  They will come and evaluate the patient.  The patients results and plan were reviewed and discussed.   Any x-rays performed were independently reviewed by myself.   Differential diagnosis were considered with the presenting HPI.  Medications  HYDROmorphone (DILAUDID) injection 0.5 mg (0.5 mg Intravenous Given 10/01/18 0403)  ondansetron (ZOFRAN) injection 4 mg (4 mg Intravenous Given 10/01/18 0403)    Vitals:   10/01/18 0355 10/01/18 0356 10/01/18 0357  BP:  127/67   Pulse:  83   Resp:  18   Temp:  98.1 F (36.7 C)   TempSrc:  Oral   SpO2: 96% 96%   Weight:   94.3 kg  Height:   5\' 6"  (1.676 m)    Final diagnoses:  Radicular pain in right arm    Admission/ observation were discussed with the admitting physician, patient and/or family and they are comfortable with the plan.    Final Clinical Impressions(s) / ED Diagnoses   Final diagnoses:  None    ED Discharge Orders    None       Melene PlanFloyd, Burk Hoctor, DO 10/01/18 [id number]

## 2018-10-01 NOTE — H&P (Addendum)
Subjective:   Patient is a 62 y.o. female seen regarding difficulty breathing over the last several days. She had an ACDF by Dr. Jordan LikesPool on Monday. She states that since Monday she has had difficulty breathing intermittently and feels like she is "choking." Denies any difficulty swallowing. She has developed right arm pain that she did not have before surgery. She has normal postop interscapular pain. Her pain has been severe. Denies any extremity weakness, redness, or oozing from surgical site. She has had a low grade fever of 100 over the last couple days.   Past Medical History:  Diagnosis Date  . Adrenal insufficiency (HCC)   . Arthritis   . Cervical stenosis of spine   . Depression   . Fibromyalgia   . GERD (gastroesophageal reflux disease)   . History of hiatal hernia   . Hypertension   . Hyperthyroidism   . Hypoglycemia   . Myocardial infarction (HCC)    mild in 2011, is not required to see cardiology now  . Pneumonia   . 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 Sleep apnea   . Spondylolisthesis of lumbar region   . TIA (transient ischemic attack)     Past Surgical History:  Procedure Laterality Date  . ABDOMINAL HYSTERECTOMY    . ANTERIOR CERVICAL DECOMP/DISCECTOMY FUSION N/A 09/26/2018   Procedure: Anterior Cervical Discectomy Fusion Cervical Four-Cervical Five - Cervical Five-Cervical Six - Cervical Six-Cervical Seven;  Surgeon: Julio SicksPool, Henry, MD;  [facility];  Service: Neurosurgery;  Laterality: N/A;  Anterior Cervical Discectomy Fusion Cervical Four-Cervical Five - Cervical Five-Cervical Six - Cervical Six-Cervical Seven  . APPENDECTOMY    . CARDIAC CATHETERIZATION    . CHOLECYSTECTOMY    . COLONOSCOPY    . DILATION AND CURETTAGE OF UTERUS    . OOPHORECTOMY     Bilateral  . OVARIAN CYST REMOVAL    . TONSILLECTOMY      Allergies  Allergen Reactions  . Penicillins Rash and Other (See Comments)    PATIENT HAS HAD A PCN REACTION WITH IMMEDIATE RASH,  FACIAL/TONGUE/THROAT SWELLING, SOB, OR LIGHTHEADEDNESS WITH HYPOTENSION:  #  #  #  YES  #  #  #   HAS PT DEVELOPED SEVERE RASH INVOLVING MUCUS MEMBRANES or SKIN NECROSIS: #  #  #  YES  #  #  #   Has patient had a PCN reaction that required hospitalization: No Has patient had a PCN reaction occurring within the last 10 years: No    . Ciprofloxacin Nausea Only, Rash and Other (See Comments)    DIZZINESS  . Milnacipran Anxiety, Rash and Other (See Comments)    DIZZINESS, WEAKNESS  . Nsaids Rash and Other (See Comments)    ULCERS   . Lactase Other (See Comments)    UNSPECIFIED REACTION   . Other Other (See Comments)    Chlorine water Acidy Food causes GI upset NO Soy, wheat, gluten, corn products  . Savella [Milnacipran Hcl] Other (See Comments)    UNSPECIFIED REACTION   . Aspirin Rash  . Latex Rash  . Pollen Extract Other (See Comments)    Stuffy nose  . Sulfa Antibiotics Rash  . Sulfamethoxazole-Trimethoprim Rash  . Sulfasalazine Rash  . Tape Rash and Other (See Comments)    Can use paper tape  . Tolmetin Rash    Social History   Tobacco Use  . Smoking status: Never Smoker  . Smokeless tobacco: Never Used  Substance Use Topics  .  Alcohol use: No    Frequency: Never    History reviewed. No pertinent family history. Prior to Admission medications   Medication Sig Start Date End Date Taking? Authorizing Provider  albuterol (PROVENTIL HFA;VENTOLIN HFA) 108 (90 Base) MCG/ACT inhaler Inhale 1-2 puffs into the lungs every 6 (six) hours as needed for wheezing or shortness of breath.    [provider]  CALCIUM-MAGNESIUM PO Take 1 tablet by mouth daily.    [provider]  cyclobenzaprine (FLEXERIL) 10 MG tablet Take 1 tablet (10 mg total) by mouth 3 (three) times daily as needed for muscle spasms. 09/27/18   Val Eagle D, NP  dexamethasone (DECADRON) 0.75 MG tablet Take 0.75 mg by mouth daily.    [provider]  diazepam (VALIUM) 5 MG tablet Take  1-2 tablets (5-10 mg total) by mouth every 6 (six) hours as needed for muscle spasms. Patient taking differently: Take 5 mg by mouth every 6 (six) hours as needed for muscle spasms.  09/22/17   Julio Sicks, MD  doxycycline (VIBRA-TABS) 100 MG tablet Take 100 mg by mouth 2 (two) times daily.    [provider]  ergocalciferol (VITAMIN D2) 1.25 MG (50000 UT) capsule Take 50,000 Units by mouth every Tuesday.    [provider]  escitalopram (LEXAPRO) 10 MG tablet Take 10 mg by mouth daily.     [provider]  esomeprazole (NEXIUM) 20 MG capsule Take 20 mg by mouth daily.    [provider]  gabapentin (NEURONTIN) 300 MG capsule Take 300 mg by mouth 2 (two) times daily.     [provider]  HYDROcodone-acetaminophen (NORCO) 10-325 MG tablet Take 1 tablet by mouth every 4 (four) hours as needed for severe pain ((score 7 to 10)). 09/27/18   Val Eagle D, NP  Melatonin 5 MG CAPS Take 10 mg by mouth at bedtime.     [provider]  metoprolol succinate (TOPROL-XL) 25 MG 24 hr tablet Take 25 mg by mouth at bedtime.     [provider]  Omega-3 Fatty Acids (FISH OIL) 1000 MG CAPS Take 1,000 mg by mouth daily.    [provider]  orphenadrine (NORFLEX) 100 MG tablet Take 100 mg by mouth 2 (two) times daily as needed for muscle spasms.     [provider]  oxyCODONE-acetaminophen (PERCOCET/ROXICET) 5-325 MG tablet Take 1 tablet by mouth every 6 (six) hours as needed for severe pain. 08/10/18   [provider]  Probiotic Product (PROBIOTIC PO) Take 1 capsule by mouth daily.    [provider]  promethazine (PHENERGAN) 25 MG tablet Take 25 mg by mouth every 6 (six) hours as needed for nausea or vomiting.    [provider]  RESTASIS 0.05 % ophthalmic emulsion Place 1 drop into both eyes 2 (two) times daily. 06/01/17   [provider]  traZODone (DESYREL) 100 MG tablet Take 200 mg by mouth at  bedtime.    [provider]     Review of Systems  Positive ROS: as above  All other systems have been reviewed and were otherwise negative with the exception of those mentioned in the HPI and as above.  Objective: Vital signs in last 24 hours: Temp:  [98.1 F (36.7 C)] 98.1 F (36.7 C) (12/21 0356) Pulse Rate:  [78-96] 82 (12/21 0430) Resp:  [18] 18 (12/21 0356) BP: (108-131)/(62-71) 108/71 (12/21 0430) SpO2:  [93 %-96 %] 93 % (12/21 0430) Weight:  [94.3 kg] 94.3 kg (  12/21 0357)  General Appearance: Alert, cooperative, no distress, appears stated age Head: Normocephalic, without obvious abnormality, atraumatic Eyes: PERRL, conjunctiva/corneas clear, EOM's intact, fundi benign, both eyes      Throat: Lips, mucosa, and tongue normal; teeth and gums normal Neck: Supple, symmetrical, trachea midline, no adenopathy; thyroid: No enlargement/tenderness/nodules; no carotid bruit or JVD Lungs: respirations unlabored Heart: Regular rate and rhythm Skin: Skin color, texture, turgor normal, no rashes or lesions, incision CDI with no redness or edema  NEUROLOGIC:   Mental status: A&O x4, no aphasia, good AS, fund of knowledge and memory Motor Exam - grossly normal Sensory Exam - grossly normal Reflexes: Coordination - not tested Gait - not tested Balance - not tested Cranial Nerves: I: smell Not tested  II: visual acuity  OS: na    OD: na  II: visual fields Full to confrontation  II: pupils Equal, round, reactive to light  III,VII: ptosis   III,IV,VI: extraocular muscles    V: mastication   V: facial light touch sensation    V,VII: corneal reflex    VII: facial muscle function - upper    VII: facial muscle function - lower   VIII: hearing   IX: soft palate elevation    IX,X: gag reflex   XI: trapezius strength    XI: sternocleidomastoid strength   XI: neck flexion strength    XII: tongue strength      Data Review Lab Results  Component Value Date   WBC 13.5  (H) 09/19/2018   HGB 14.2 09/19/2018   HCT 46.6 (H) 09/19/2018   MCV 89.1 09/19/2018   PLT 305 09/19/2018   Lab Results  Component Value Date   NA 139 09/19/2018   K 5.3 (H) 09/19/2018   CL 104 09/19/2018   CO2 22 09/19/2018   BUN 16 09/19/2018   CREATININE 1.08 (H) 09/19/2018   GLUCOSE 94 09/19/2018   No results found for: INR, PROTIME  Assessment/Plan: [age] year old patient presented to the ED 6 days postop from her 3 level acdf. She has had difficulty breathing at times and felt like she was choking. CT [facility] showed prevertebral fluid collection spanning C7-T1 measuring 4.6x2.9x3cm. Patient does not appear to be in distress right now. Able to have conversation with no difficulty. I do not believe that she needs any surgical intervention right now as she looks pretty good on physical exam. We will admit her for observation and order IV decadron for now.     Tiana LoftKimberly Hannah Meyran 10/01/2018 5:10 AM   Agree with above.patient in no acute respiratory distress condition of intermittent dysphagia which gets her concern feels like she is choking and can catch her breath. Currently patient is not short of breath and denies any difficulty breathing. Patient does have some right upper extremity radiculitis strength appears to be good. Wall continue the patient on IV steroids and observe over the next 24 hours.

## 2018-10-01 NOTE — ED Triage Notes (Signed)
  Patient transferred from [facility] via carelink for neck pain.  Patient had spinal fusion surgery 5 days ago [facility] and has been having neck pain radiating down her arms, and throat pain.  Patient states it has been hard to swallow and feels hard to breathe.  CT scan was done at other hospital and showed possible abscess in her neck.  Patient is A&O x4.  Pain 7/10. VSS

## 2018-10-01 NOTE — ED Notes (Signed)
[place] [facility] Provider at bedside with pt and husband

## 2018-10-01 NOTE — Progress Notes (Signed)
Subjective: Patient reports breathing is better, but very sore in right arm  Objective: Vital signs in last 24 hours: Temp:  [97.9 F (36.6 C)-98.4 F (36.9 C)] 98.4 F (36.9 C) (12/21 1219) Pulse Rate:  [78-96] 88 (12/21 1219) Resp:  [17-18] 17 (12/21 1219) BP: (108-133)/(61-84) 133/79 (12/21 1219) SpO2:  [93 %-96 %] 93 % (12/21 1219) Weight:  [94.3 kg-96.6 kg] 96.6 kg (12/21 0655)  Intake/Output from previous day: No intake/output data recorded. Intake/Output this shift: Total I/O In: 240 [P.O.:240] Out: -   Physical Exam: Strength full both upper extremities.  Some periscapular pain right with tenderness over right AC joint.  Dressing CDI without palpable swelling.  No difficulty with speech, diet, swallowing, breathing.  Lab Results: No results for input(s): WBC, HGB, HCT, PLT in the last 72 hours. BMET No results for input(s): NA, K, CL, CO2, GLUCOSE, BUN, CREATININE, CALCIUM in the last 72 hours.  Studies/Results: No results found.  Assessment/Plan: Patient is improving.  Continue to observe.  Advance diet.  Steroids should help with right arm pain.  Will monitor.      LOS: 0 days    Dorian HeckleJoseph D Julitza Rickles, MD 10/01/2018, 3:12 PM

## 2018-10-01 NOTE — ED Notes (Signed)
Pt noted to be ambulatory to and from the bathroom. Steady gait, no difficulty noted.

## 2018-10-02 MED ORDER — DIAZEPAM 5 MG PO TABS
5.0000 mg | ORAL_TABLET | Freq: Four times a day (QID) | ORAL | Status: DC | PRN
  Administered 2018-10-02 – 2018-10-04 (×7): 5 mg via ORAL
  Filled 2018-10-02 (×7): qty 1

## 2018-10-02 NOTE — Progress Notes (Addendum)
Patient ID: Felicia Ponce, female   DOB: [birth year], 62 y.o.   MRN: [id number] Vital signs are stable Patient still complaining of severe right shoulder pain breathing is better but swallowing is difficult She notes she has little energy She notes that she has Graves' disease and Addison's disease and is on chronic steroid replacement but she has been on increased steroid doses currently Like something better for pain control and I discussed using Valium to help with the right shoulder pain.  Patient cannot tolerate Toradol secondary to multiple NSAID allergies

## 2018-10-03 DIAGNOSIS — G4730 Sleep apnea, unspecified: Secondary | ICD-10-CM | POA: Diagnosis present

## 2018-10-03 DIAGNOSIS — Z981 Arthrodesis status: Secondary | ICD-10-CM | POA: Diagnosis not present

## 2018-10-03 DIAGNOSIS — Z91048 Other nonmedicinal substance allergy status: Secondary | ICD-10-CM | POA: Diagnosis not present

## 2018-10-03 DIAGNOSIS — Z8701 Personal history of pneumonia (recurrent): Secondary | ICD-10-CM | POA: Diagnosis not present

## 2018-10-03 DIAGNOSIS — Z880 Allergy status to penicillin: Secondary | ICD-10-CM | POA: Diagnosis not present

## 2018-10-03 DIAGNOSIS — Z7952 Long term (current) use of systemic steroids: Secondary | ICD-10-CM | POA: Diagnosis not present

## 2018-10-03 DIAGNOSIS — Z79890 Hormone replacement therapy (postmenopausal): Secondary | ICD-10-CM | POA: Diagnosis not present

## 2018-10-03 DIAGNOSIS — R0600 Dyspnea, unspecified: Secondary | ICD-10-CM | POA: Diagnosis present

## 2018-10-03 DIAGNOSIS — E0500 Thyrotoxicosis with diffuse goiter without thyrotoxic crisis or storm: Secondary | ICD-10-CM | POA: Diagnosis present

## 2018-10-03 DIAGNOSIS — M1990 Unspecified osteoarthritis, unspecified site: Secondary | ICD-10-CM | POA: Diagnosis present

## 2018-10-03 DIAGNOSIS — M5410 Radiculopathy, site unspecified: Secondary | ICD-10-CM | POA: Diagnosis present

## 2018-10-03 DIAGNOSIS — K219 Gastro-esophageal reflux disease without esophagitis: Secondary | ICD-10-CM | POA: Diagnosis present

## 2018-10-03 DIAGNOSIS — Z9049 Acquired absence of other specified parts of digestive tract: Secondary | ICD-10-CM | POA: Diagnosis not present

## 2018-10-03 DIAGNOSIS — M797 Fibromyalgia: Secondary | ICD-10-CM | POA: Diagnosis present

## 2018-10-03 DIAGNOSIS — I252 Old myocardial infarction: Secondary | ICD-10-CM | POA: Diagnosis not present

## 2018-10-03 DIAGNOSIS — Z881 Allergy status to other antibiotic agents status: Secondary | ICD-10-CM | POA: Diagnosis not present

## 2018-10-03 DIAGNOSIS — Z882 Allergy status to sulfonamides status: Secondary | ICD-10-CM | POA: Diagnosis not present

## 2018-10-03 DIAGNOSIS — I10 Essential (primary) hypertension: Secondary | ICD-10-CM | POA: Diagnosis present

## 2018-10-03 DIAGNOSIS — Z8673 Personal history of transient ischemic attack (TIA), and cerebral infarction without residual deficits: Secondary | ICD-10-CM | POA: Diagnosis not present

## 2018-10-03 DIAGNOSIS — Z888 Allergy status to other drugs, medicaments and biological substances status: Secondary | ICD-10-CM | POA: Diagnosis not present

## 2018-10-03 DIAGNOSIS — Z91040 Latex allergy status: Secondary | ICD-10-CM | POA: Diagnosis not present

## 2018-10-03 DIAGNOSIS — R52 Pain, unspecified: Secondary | ICD-10-CM | POA: Diagnosis present

## 2018-10-03 DIAGNOSIS — R1310 Dysphagia, unspecified: Secondary | ICD-10-CM | POA: Diagnosis present

## 2018-10-03 DIAGNOSIS — Z79899 Other long term (current) drug therapy: Secondary | ICD-10-CM | POA: Diagnosis not present

## 2018-10-03 DIAGNOSIS — E271 Primary adrenocortical insufficiency: Secondary | ICD-10-CM | POA: Diagnosis present

## 2018-10-03 NOTE — Progress Notes (Signed)
Patient readmitted this weekend with complaints of dysphagia.  Work-up including an outside CT scan demonstrates some prevertebral fluid but no evidence of significant mass lesion and no indication for reexploration.  Patient has improved with IV steroids.  Currently she has some mild dysphagia.  She is having no breathing difficulties.  She complains of right scapular and right upper extremity pain.  She has no weakness or sensory loss however.  On examination she is awake and alert.  She is oriented and appropriate.  She appears to be in no distress.  Her neck is soft and flat.  Airway is midline.  Motor examination is intact bilateral.  Sensory examination is intact bilaterally.  Patient does have some pain with external rotation of her right shoulder.  I reviewed the CT scan of the patient cervical spine.  Demonstrates good appearance for decompression and fusion at C4-5, C5-6 and C6-7.  There is no evidence of hardware complications or other structural issues.  She does have some mild prevertebral fluid but no severe compressive hematoma.  Overall the patient looks pretty good.  I think she is having some rebound radicular pain which hopefully responded time and steroids.  Overall her CT scan looks good with regard to her decompression and fusion.  Patient requests 1 more day of hospitalization before going home tomorrow.

## 2018-10-03 NOTE — Progress Notes (Signed)
Orthopedic Tech Progress Note Patient Details:  Felicia Ponce 23-Aug-1956 [id number] Applied with Felicia BrandJennifer   Ortho Devices Type of Ortho Device: Arm sling Ortho Device/Splint Location: LRE Ortho Device/Splint Interventions: Adjustment, Application, Ordered   Post Interventions Patient Tolerated: Well Instructions Provided: Care of device   Felicia Ponce 10/03/2018, 1:08 PM

## 2018-10-04 LAB — TROPONIN I: Troponin I: <0.03 ng/mL (ref <0.03)

## 2018-10-04 MED ORDER — METHYLPREDNISOLONE 4 MG PO TBPK: ORAL_TABLET | ORAL | 0 refills | Status: DC

## 2018-10-04 MED ORDER — ONDANSETRON HCL 4 MG/2ML IJ SOLN
4.0000 mg | Freq: Four times a day (QID) | INTRAMUSCULAR | Status: DC
  Administered 2018-10-04 (×2): 4 mg via INTRAVENOUS
  Filled 2018-10-04 (×2): qty 2

## 2018-10-04 MED ORDER — ALUMINUM HYDROXIDE GEL 320 MG/5ML PO SUSP
30.0000 mL | Freq: Four times a day (QID) | ORAL | Status: DC | PRN
  Filled 2018-10-04: qty 30

## 2018-10-04 MED ORDER — OXYCODONE-ACETAMINOPHEN 5-325 MG PO TABS: 1.0000 | ORAL_TABLET | Freq: Four times a day (QID) | ORAL | 0 refills | Status: DC | PRN

## 2018-10-04 NOTE — Progress Notes (Signed)
Pt being discharged from hospital per orders from MD. Pt educated on discharge instructions. Pt verbalized understanding of instructions. All questions and concerns were addressed. Pt's IV was removed prior to discharge. Pt exited hospital via wheelchair accompanied by staff. 

## 2018-10-04 NOTE — Progress Notes (Signed)
Patient with atypical chest pain earlier this morning.  Symptoms resolved spontaneously.  EKG and troponin level negative.  Patient feels at baseline and feels ready for discharge today.

## 2018-10-04 NOTE — Discharge Summary (Signed)
Physician Discharge Summary  Patient ID: Felicia Ponce MRN: [id number] DOB/AGE: 14-Dec-1955 62 y.o.  Admit date: 10/01/2018 Discharge date: 10/04/2018  Admission Diagnoses:  Discharge Diagnoses:  Active Problems:   H/O cervical spine surgery   Discharged Condition: good  Hospital Course: Patient admitted to the hospital secondary to dysphagia and worrisome neck swelling.  Patient evaluated and found to have normal postoperative fluid collection without evidence of compressive lesion.  She has been observed over time.  Her neck swelling and dysphagia improved rapidly with IV steroid administration.  She is also had some lingering right lower extremity radicular pain which is slowly improving.  Patient mobilizing without difficulty.  She feels ready for discharge home.  Currently she is tolerating regular diet.  She is having no respiratory issues.  Consults:   Significant Diagnostic Studies:   Treatments:   Discharge Exam: Blood pressure 125/63, pulse 80, temperature 98.6 F (37 C), temperature source Oral, resp. rate 20, height 5\' 5"  (1.651 m), weight 96.6 kg, SpO2 90 %. Awake and alert.  Oriented and appropriate.  Speech is fluent.  Motor examination intact bilateral.  Sensory examination nonfocal.  Wound is healing well.  Neck is soft and flat.  Airway midline.  No respiratory distress.  Chest and abdomen benign.  Disposition: Discharge disposition: 01-Home or Self Care        Allergies as of 10/04/2018      Reactions   Penicillins Rash, Other (See Comments)   PATIENT HAS HAD A PCN REACTION WITH IMMEDIATE RASH, FACIAL/TONGUE/THROAT SWELLING, SOB, OR LIGHTHEADEDNESS WITH HYPOTENSION:  #  #  #  YES  #  #  #   HAS PT DEVELOPED SEVERE RASH INVOLVING MUCUS MEMBRANES or SKIN NECROSIS: #  #  #  YES  #  #  #   Has patient had a PCN reaction that required hospitalization: No Has patient had a PCN reaction occurring within the last 10 years: No   Ciprofloxacin Nausea Only,  Rash, Other (See Comments)   DIZZINESS   Milnacipran Anxiety, Rash, Other (See Comments)   DIZZINESS, WEAKNESS   Nsaids Rash, Other (See Comments)   ULCERS   Lactase Other (See Comments)   UNSPECIFIED REACTION    Other Other (See Comments)   Chlorine water Acidy Food causes GI upset NO Soy, wheat, gluten, corn products   Savella [milnacipran Hcl] Other (See Comments)   UNSPECIFIED REACTION    Aspirin Rash   Latex Rash   Pollen Extract Other (See Comments)   Stuffy nose   Sulfa Antibiotics Rash   Sulfamethoxazole-trimethoprim Rash   Sulfasalazine Rash   Tape Rash, Other (See Comments)   Can use paper tape   Tolmetin Rash      Medication List    TAKE these medications   albuterol 108 (90 Base) MCG/ACT inhaler Commonly known as:  PROVENTIL HFA;VENTOLIN HFA Inhale 1-2 puffs into the lungs every 6 (six) hours as needed for wheezing or shortness of breath.   CALCIUM-MAGNESIUM PO Take 1 tablet by mouth daily.   cyclobenzaprine 10 MG tablet Commonly known as:  FLEXERIL Take 1 tablet (10 mg total) by mouth 3 (three) times daily as needed for muscle spasms.   dexamethasone 0.75 MG tablet Commonly known as:  DECADRON Take 0.75 mg by mouth daily.   diazepam 5 MG tablet Commonly known as:  VALIUM Take 1-2 tablets (5-10 mg total) by mouth every 6 (six) hours as needed for muscle spasms. What changed:  how much to take  doxycycline 100 MG tablet Commonly known as:  VIBRA-TABS Take 100 mg by mouth 2 (two) times daily.   ergocalciferol 1.25 MG (50000 UT) capsule Commonly known as:  VITAMIN D2 Take 50,000 Units by mouth every Tuesday.   escitalopram 10 MG tablet Commonly known as:  LEXAPRO Take 10 mg by mouth daily.   esomeprazole 20 MG capsule Commonly known as:  NEXIUM Take 20 mg by mouth daily.   Fish Oil 1000 MG Caps Take 1,000 mg by mouth daily.   gabapentin 300 MG capsule Commonly known as:  NEURONTIN Take 300 mg by mouth 2 (two) times daily.    HYDROcodone-acetaminophen 10-325 MG tablet Commonly known as:  NORCO Take 1 tablet by mouth every 4 (four) hours as needed for severe pain ((score 7 to 10)).   Melatonin 5 MG Caps Take 10 mg by mouth at bedtime.   methylPREDNISolone 4 MG Tbpk tablet Commonly known as:  MEDROL DOSEPAK follow package directions   metoprolol succinate 25 MG 24 hr tablet Commonly known as:  TOPROL-XL Take 25 mg by mouth at bedtime.   orphenadrine 100 MG tablet Commonly known as:  NORFLEX Take 100 mg by mouth 2 (two) times daily as needed for muscle spasms.   oxyCODONE-acetaminophen 5-325 MG tablet Commonly known as:  PERCOCET/ROXICET Take 1-2 tablets by mouth every 6 (six) hours as needed for severe pain. What changed:  how much to take   PROBIOTIC PO Take 1 capsule by mouth daily.   promethazine 25 MG tablet Commonly known as:  PHENERGAN Take 25 mg by mouth every 6 (six) hours as needed for nausea or vomiting.   RESTASIS 0.05 % ophthalmic emulsion Generic drug:  cycloSPORINE Place 1 drop into both eyes 2 (two) times daily.   traZODone 100 MG tablet Commonly known as:  DESYREL Take 200 mg by mouth at bedtime.        Signed: Kathaleen MaserHenry A Shweta Aman 10/04/2018, 11:39 AM

## 2019-12-10 IMAGING — RF DG LUMBAR SPINE 2-3V
1 series · 2 of 2 positions shown · non-contrast
Comparison: Lumbar spine radiographs dated 08/19/2017

CLINICAL DATA: L4-5 PLIF

EXAM:
DG C-ARM 61-120 MIN; LUMBAR SPINE - 2-3 VIEW
FLUOROSCOPY TIME:  28 seconds

[Series 1: run · 2 of 2 slices shown]
[im 1/2]
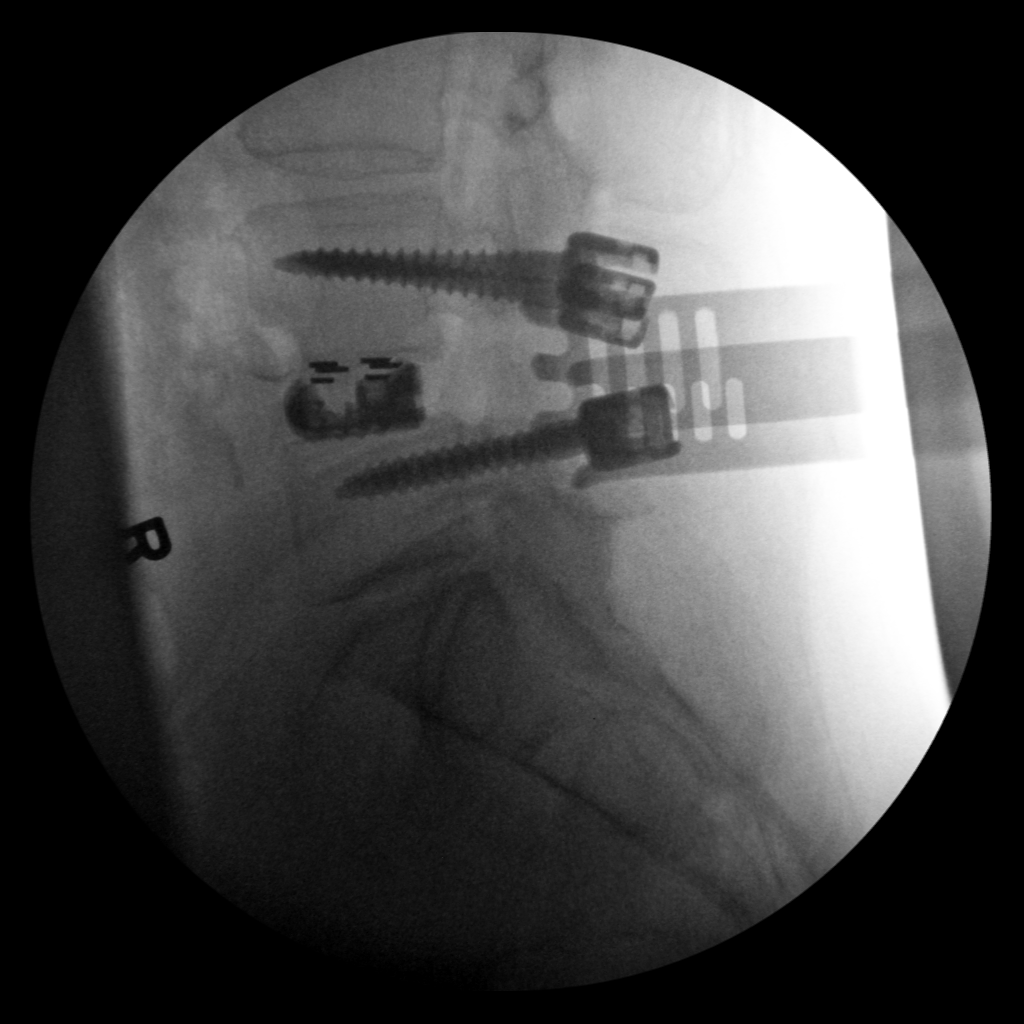
[im 2/2]
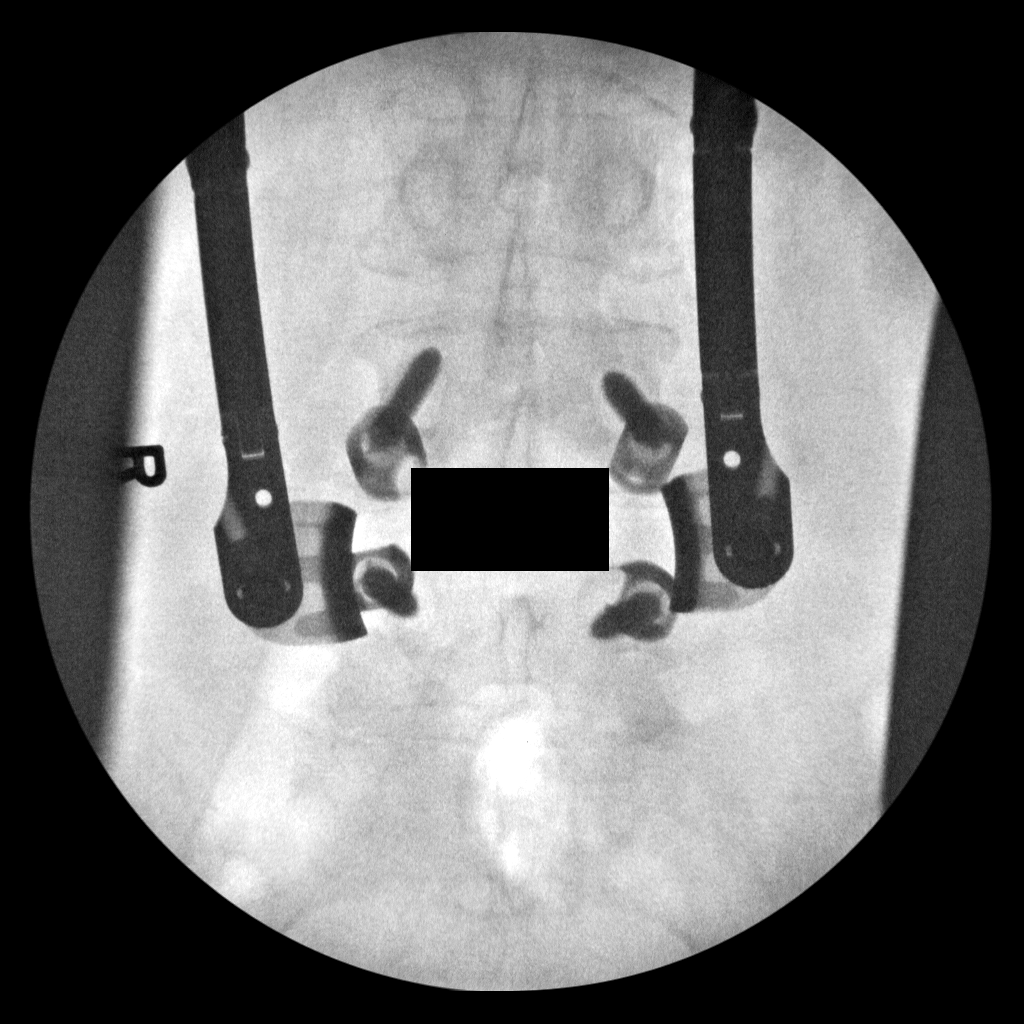

[2 of 2 positions shown; findings below may reference images not displayed]

FINDINGS: Frontal and lateral fluoroscopic spot images during L4-5 PLIF
demonstrate surgical hardware in satisfactory position.
IMPRESSION: Intraoperative fluoroscopic spot images during L4-5 PLIF, as above.

## 2020-09-29 IMAGING — RF DG C-ARM 61-120 MIN
1 series · 2 of 2 positions shown · non-contrast
Comparison: Cervical spine radiographs 08/10/2018 and MRI
08/30/2018

CLINICAL DATA: Cervical spine surgery.

EXAM:
DG CERVICAL SPINE - 1 VIEW; DG C-ARM 61-120 MIN

[Series 1: run · 2 of 2 slices shown]
[im 1/2]
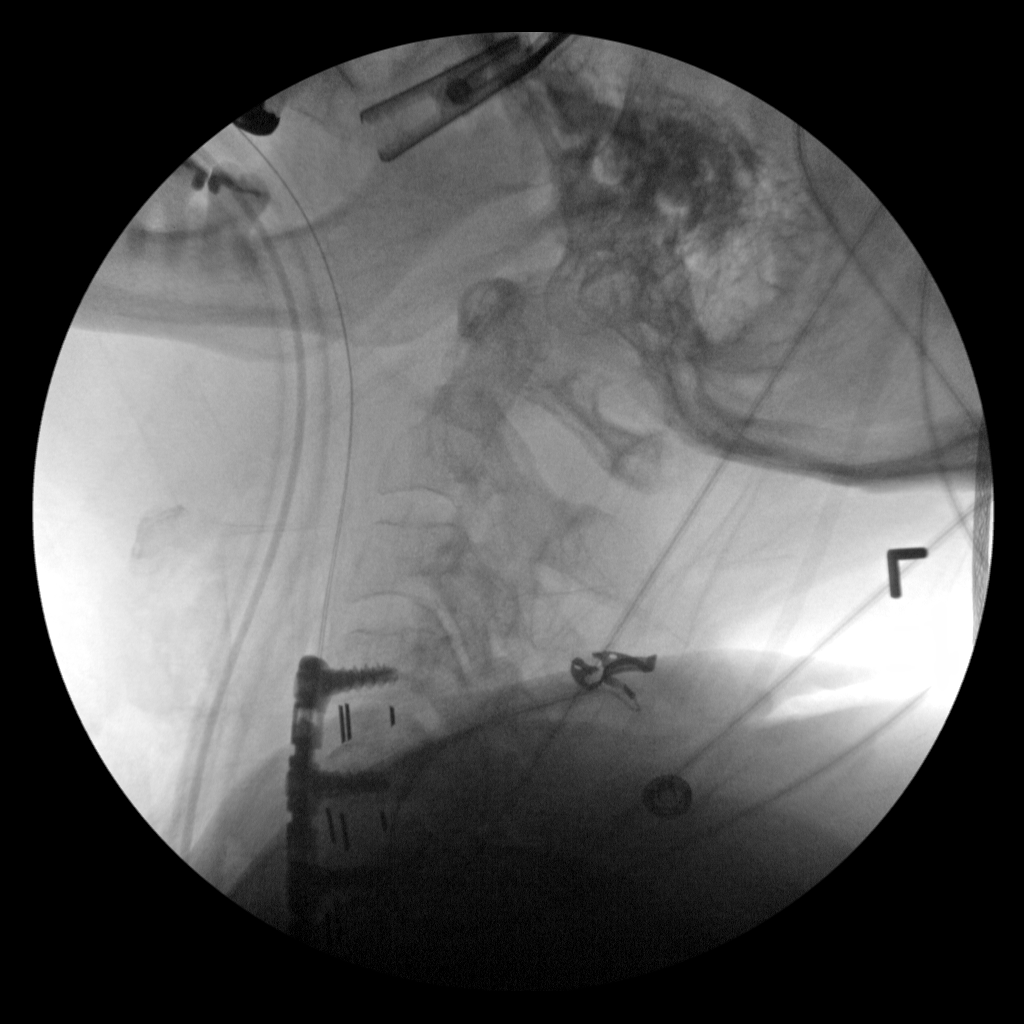
[im 2/2]
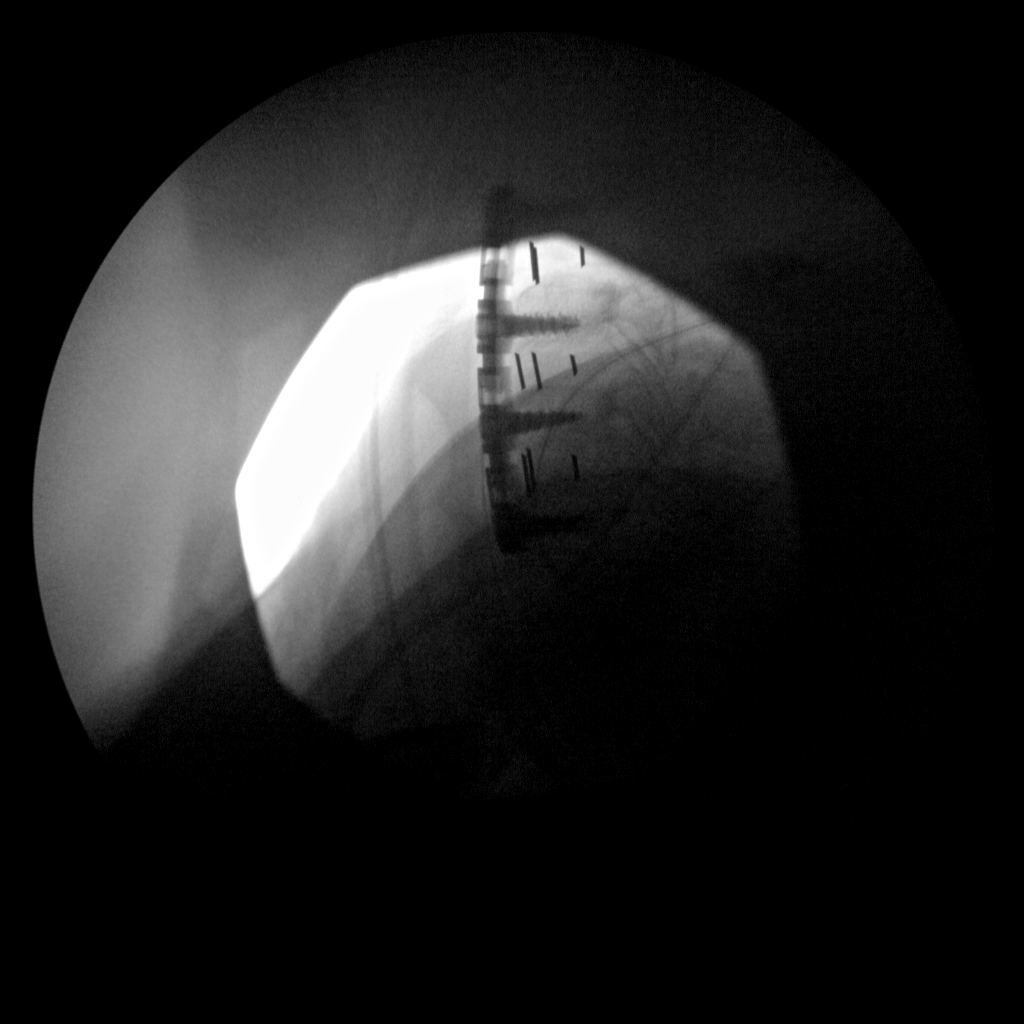

[2 of 2 positions shown; findings below may reference images not displayed]

FLUOROSCOPY TIME:  C-arm fluoroscopic images were obtained
intraoperatively and submitted for post operative interpretation.
Please see the performing provider's procedural report for the
fluoroscopy time utilized.
FINDINGS: Two intraoperative fluoroscopic images of the cervical spine are
provided. ACD has been performed from C4-C7 with placement of an
anterior fusion plate, screws, and interbody implants. Endotracheal
and enteric tubes are partially visualized.
IMPRESSION: Intraoperative images during C4-C7 ACDF.

## 2022-10-21 ENCOUNTER — Other Ambulatory Visit: Payer: Self-pay | Admitting: Neurosurgery

## 2022-10-29 ENCOUNTER — Encounter (HOSPITAL_COMMUNITY): Payer: Self-pay | Admitting: Neurosurgery

## 2022-10-29 ENCOUNTER — Other Ambulatory Visit: Payer: Self-pay

## 2022-10-29 NOTE — Progress Notes (Addendum)
Anesthesia Chart Review: Same day workup  Pt follows with cardiologist Dr. Babette Relic for history of HTN, HLD, remote history of DVT at age 66, TIA, LE edema, reported MI (patient reports "mild" MI in 2014, however there is no corroboration of this in her medical records that I can find), nonobstructive CAD.  Cardiac cath 01/13/2013 showed widely patent coronaries.  I cannot directly review Dr.Vybiral's notes, however, patient was seen by her PCP Barrie Dunker, PA-C on 05/11/2022 and her note details recent cardiac testing.  Per note, she had nuclear stress test 10/2021 that showed normal perfusion and echocardiogram 10/2020 that showed LVEF 55%, mild concentric LVH, mild MR, mild TR, mild PR.  She also notes that cardiology note dated 11/17/2021 made no mention of prior history of MI or TIA. Pt was cleared at that time for left TKA which she did have at [facility] on 2/99/37 without complication.  History of adrenal insufficiency followed by endocrinologist Dr. Ammie Dalton.  She is maintained on Decadron 0.75 mg daily she has standing instructions regarding perioperative stress dosing.  Per notes, "The day before surgery she should take her usual steroids. -In holding area DOS, she should be given 50mg  hydrocortisone IV, then 25mg  q 8 hrs until she is taking oral medications. -Once she is taking oral meds, she can resume her usual replacement steroids. -If she remains acutely ill (ie persistent nausea or fever) then continue 25mg  hydrocortisone IV twice daily until her acute illness resolves, then resume her baseline replacement."  History of rheumatoid arthritis, maintained on Plaquenil and methotrexate.  History of OSA, noncompliant with CPAP.  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  She is reportedly prediabetic related to long-term steroid use.  She is on once weekly GLP-1 agonist Ozempic.  Patient will need day of surgery labs and evaluation.  EKG 05/11/2022 (Care Everywhere): NSR.  Rate 69.  Inferior infarct.  Cannot rule out  anterior infarct, age-indeterminate.  No significant change from prior.   PFT 04/05/2017 (care everywhere): Component Name Value Ref Range  FEV1 2.34  Comment: 107% liters  FVC 3.09  Comment: 103% liters  FEV1/FVC 76  Comment: 76% %  TLC 4.78  Comment: 98% liters  DLCO 18.3  Comment: 78% ml/mmHg sec  PEAK FLOW    Comment: 96% 20 - 800 L/MIN  Result Impression  Normal spirometry lung volumes and diffusing capacity      Cath 01/16/2013 (care everywhere): CONCLUSIONS:       CORONARY STATUS: Mild non-obstructive ASCAD         LV FUNCTION:              Ejection Fraction:   60%              Wall Motion:         Normal            OTHER:  Widely patent coronaries. Normal left main. Normal left ventricular function. Rt brachiocephalic artery 16% prox.  Gradients peak, mean 20, 6 mmHg     Felicia Ponce [facility]/Anesthesiology Phone [phone] 10/29/2022 11:08 AM

## 2022-10-29 NOTE — Anesthesia Preprocedure Evaluation (Addendum)
Anesthesia Evaluation  Patient identified by MRN, date of birth, ID band Patient awake    Reviewed: Allergy & Precautions, H&P , NPO status , Patient's Chart, lab work & pertinent test results  Airway Mallampati: II  TM Distance: >3 FB Neck ROM: Full    Dental no notable dental hx.    Pulmonary sleep apnea    Pulmonary exam normal breath sounds clear to auscultation       Cardiovascular hypertension, Pt. on medications + Past MI  Normal cardiovascular exam Rhythm:Regular Rate:Normal     Neuro/Psych    Depression    TIA negative psych ROS   GI/Hepatic Neg liver ROS,GERD  ,,  Endo/Other   Hyperthyroidism   Renal/GU negative Renal ROS  negative genitourinary   Musculoskeletal  (+) Arthritis , Osteoarthritis,  Fibromyalgia -  Abdominal  (+) + obese  Peds negative pediatric ROS (+)  Hematology negative hematology ROS (+)   Anesthesia Other Findings   Reproductive/Obstetrics negative OB ROS                             Anesthesia Physical Anesthesia Plan  ASA: 3  Anesthesia Plan: General   Post-op Pain Management: Dilaudid IV   Induction: Intravenous  PONV Risk Score and Plan: 3 and Ondansetron, Dexamethasone, Midazolam and Treatment may vary due to age or medical condition  Airway Management Planned: Oral ETT  Additional Equipment:   Intra-op Plan:   Post-operative Plan: Extubation in OR  Informed Consent: I have reviewed the patients History and Physical, chart, labs and discussed the procedure including the risks, benefits and alternatives for the proposed anesthesia with the patient or authorized representative who has indicated his/her understanding and acceptance.     Dental advisory given  Plan Discussed with: CRNA  Anesthesia Plan Comments: (PAT note by Karoline Caldwell, PA-C: Pt follows with cardiologist Dr. Babette Relic for history of HTN, HLD, remote history of DVT at age  21, TIA, LE edema, reported MI (patient reports "mild" MI in 2014, however there is no corroboration of this in her medical records that I can find), nonobstructive CAD.  Cardiac cath 01/13/2013 showed widely patent coronaries.  I cannot directly review Dr.Vybiral's notes, however, patient was seen by her PCP Barrie Dunker, PA-C on 05/11/2022 and her note details recent cardiac testing.  Per note, she had nuclear stress test 10/2021 that showed normal perfusion and echocardiogram 10/2020 that showed LVEF 55%, mild concentric LVH, mild MR, mild TR, mild PR.  She also notes that cardiology note dated 11/17/2021 made no mention of prior history of MI or TIA. Pt was cleared at that time for left TKA which she did have at [facility] on 7/84/69 without complication.  History of adrenal insufficiency followed by endocrinologist Dr. Ammie Dalton.  She is maintained on Decadron 0.75 mg daily she has standing instructions regarding perioperative stress dosing.  Per notes, "The day before surgery she should take her usual steroids. -In holding area DOS, she should be given 50mg  hydrocortisone IV, then 25mg  q 8 hrs until she is taking oral medications. -Once she is taking oral meds, she can resume her usual replacement steroids. -If she remains acutely ill (ie persistent nausea or fever) then continue 25mg  hydrocortisone IV twice daily until her acute illness resolves, then resume her baseline replacement."  History of rheumatoid arthritis, maintained on Plaquenil and methotrexate.  History of OSA, noncompliant with CPAP.  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  She is reportedly prediabetic  related to long-term steroid use.  She is on once weekly GLP-1 agonist Ozempic.  Patient will need day of surgery labs and evaluation.  EKG 05/11/2022 (Care Everywhere): NSR.  Rate 69.  Inferior infarct.  Cannot rule out anterior infarct, age-indeterminate.  No significant change from prior.   PFT 04/05/2017 (care everywhere): Component Name Value Ref  Range FEV1 2.34 Comment: 107% liters FVC 3.09 Comment: 103% liters FEV1/FVC 76 Comment: 76% % TLC 4.78 Comment: 98% liters DLCO 18.3 Comment: 78% ml/mmHg sec PEAK FLOW  Comment: 96% 20 - 800 L/MIN Result Impression Normal spirometry lung volumes and diffusing capacity   Cath 01/16/2013 (care everywhere): CONCLUSIONS:  CORONARY STATUS: Mild non-obstructive ASCAD  LV FUNCTION: Ejection Fraction:60% Wall Motion:Normal  OTHER:Widely patent coronaries. Normal left main. Normal left ventricular function. Rt brachiocephalic artery 71% prox.Gradients peak, mean 20, 6 mmHg  )        Anesthesia Quick Evaluation

## 2022-10-29 NOTE — Progress Notes (Signed)
PCP - Andras neumark Cardiologist - Dy[facility] in [place]  PPM/ICD - denies   Chest x-ray - denies EKG - DOS Stress Test - 2018-care everywhere ECHO - 2012- care everywhere Cardiac Cath - 2014- care everywhere  CPAP - +OSA- does not wear cpap    Last dose of OZEMPIC was 09/10/22  ERAS Protcol - NPO  COVID TEST- not needed  Anesthesia review: yes, cardiac history  Patient verbally denies any shortness of breath, fever, cough and chest pain during phone call   -------------  SDW INSTRUCTIONS given:  Your procedure is scheduled on Friday, 10/30/2022.Marland Kitchen  Report to [facility] Main Entrance "A" at 9:30 A.M., and check in at the Admitting office.  Call this number if you have problems the morning of surgery:  [phone]   Remember:  Do not eat or drink after midnight the night before your surgery     Take these medicines the morning of surgery with A SIP OF WATER  baclofen (LIORESAL)  busPIRone (BUSPAR)  dexamethasone (DECADRON)  DULoxetine (CYMBALTA) esomeprazole (NEXIUM)  gabapentin (NEURONTIN)  oxyCODONE-acetaminophen (PERCOCET)  pravastatin (PRAVACHOL)  RESTASIS eye drops  IF NEEDED: albuterol (PROVENTIL HFA;VENTOLIN HFA) inhaler promethazine (PHENERGAN)   As of today, STOP taking any Aspirin (unless otherwise instructed by your surgeon) Aleve, Naproxen, Ibuprofen, Motrin, Advil, Goody's, BC's, all herbal medications, fish oil, methotraxate, plaquenil and all vitamins.                      Do not wear jewelry, make up, or nail polish            Do not wear lotions, powders, colognes, or deodorant.            Men may shave face and neck.            Do not bring valuables to the hospital.            [facility] is not responsible for any belongings or valuables.  Do NOT Smoke (Tobacco/Vaping) 24 hours prior to your procedure If you use a CPAP at night, you may bring all equipment for your overnight stay.   Contacts, glasses, dentures or bridgework may not be  worn into surgery.      For patients admitted to the hospital, discharge time will be determined by your treatment team.   Patients discharged the day of surgery will not be allowed to drive home, and someone needs to stay with them for 24 hours.    Special instructions:   [place]- Preparing For Surgery  Before surgery, you can play an important role. Because skin is not sterile, your skin needs to be as free of germs as possible. You can reduce the number of germs on your skin by washing with CHG (chlorahexidine gluconate) Soap before surgery.  CHG is an antiseptic cleaner which kills germs and bonds with the skin to continue killing germs even after washing.    Oral Hygiene is also important to reduce your risk of infection.  Remember - BRUSH YOUR TEETH THE MORNING OF SURGERY WITH YOUR REGULAR TOOTHPASTE  Please do not use if you have an allergy to CHG or antibacterial soaps. If your skin becomes reddened/irritated stop using the CHG.  Do not shave (including legs and underarms) for at least 48 hours prior to first CHG shower. It is OK to shave your face.  Please follow these instructions carefully.   Shower the NIGHT BEFORE SURGERY and the MORNING OF SURGERY with DIAL  Soap.   Pat yourself dry with a CLEAN TOWEL.  Wear CLEAN PAJAMAS to bed the night before surgery  Place CLEAN SHEETS on your bed the night of your first shower and DO NOT SLEEP WITH PETS.   Day of Surgery: Please shower morning of surgery  Wear Clean/Comfortable clothing the morning of surgery Do not apply any deodorants/lotions.   Remember to brush your teeth WITH YOUR REGULAR TOOTHPASTE.   Questions were answered. Patient verbalized understanding of instructions.

## 2022-10-30 ENCOUNTER — Observation Stay (HOSPITAL_COMMUNITY)
Admission: RE | Admit: 2022-10-30 | Discharge: 2022-10-31 | Disposition: A | Discharge status: Home / Self Care | Payer: Medicare HMO | Attending: Neurosurgery | Admitting: Neurosurgery

## 2022-10-30 ENCOUNTER — Encounter (HOSPITAL_COMMUNITY)
Admission: RE | Disposition: A | Discharge status: Home / Self Care | Payer: Self-pay | Source: Home / Self Care | Attending: Neurosurgery

## 2022-10-30 ENCOUNTER — Ambulatory Visit (HOSPITAL_BASED_OUTPATIENT_CLINIC_OR_DEPARTMENT_OTHER): Payer: Medicare HMO | Admitting: Physician Assistant

## 2022-10-30 ENCOUNTER — Ambulatory Visit (HOSPITAL_COMMUNITY): Payer: Medicare HMO | Admitting: Physician Assistant

## 2022-10-30 ENCOUNTER — Other Ambulatory Visit: Payer: Self-pay

## 2022-10-30 ENCOUNTER — Encounter (HOSPITAL_COMMUNITY): Payer: Self-pay | Admitting: Neurosurgery

## 2022-10-30 ENCOUNTER — Ambulatory Visit (HOSPITAL_COMMUNITY): Payer: Medicare HMO

## 2022-10-30 DIAGNOSIS — I252 Old myocardial infarction: Secondary | ICD-10-CM | POA: Diagnosis not present

## 2022-10-30 DIAGNOSIS — Z91040 Latex allergy status: Secondary | ICD-10-CM | POA: Diagnosis not present

## 2022-10-30 DIAGNOSIS — M5116 Intervertebral disc disorders with radiculopathy, lumbar region: Secondary | ICD-10-CM

## 2022-10-30 DIAGNOSIS — E039 Hypothyroidism, unspecified: Secondary | ICD-10-CM | POA: Insufficient documentation

## 2022-10-30 DIAGNOSIS — Z8673 Personal history of transient ischemic attack (TIA), and cerebral infarction without residual deficits: Secondary | ICD-10-CM | POA: Diagnosis not present

## 2022-10-30 DIAGNOSIS — M48061 Spinal stenosis, lumbar region without neurogenic claudication: Secondary | ICD-10-CM | POA: Diagnosis present

## 2022-10-30 DIAGNOSIS — E0590 Thyrotoxicosis, unspecified without thyrotoxic crisis or storm: Secondary | ICD-10-CM | POA: Diagnosis not present

## 2022-10-30 DIAGNOSIS — I10 Essential (primary) hypertension: Secondary | ICD-10-CM

## 2022-10-30 DIAGNOSIS — M5126 Other intervertebral disc displacement, lumbar region: Principal | ICD-10-CM | POA: Insufficient documentation

## 2022-10-30 DIAGNOSIS — M4726 Other spondylosis with radiculopathy, lumbar region: Secondary | ICD-10-CM | POA: Insufficient documentation

## 2022-10-30 DIAGNOSIS — Z6836 Body mass index (BMI) 36.0-36.9, adult: Secondary | ICD-10-CM

## 2022-10-30 DIAGNOSIS — Z79899 Other long term (current) drug therapy: Secondary | ICD-10-CM | POA: Insufficient documentation

## 2022-10-30 DIAGNOSIS — G4730 Sleep apnea, unspecified: Secondary | ICD-10-CM

## 2022-10-30 DIAGNOSIS — E669 Obesity, unspecified: Secondary | ICD-10-CM

## 2022-10-30 LAB — BASIC METABOLIC PANEL
Anion gap: 13 (ref 5–15)
BUN: 19 mg/dL (ref 8–23)
CO2: 21 mmol/L — ABNORMAL LOW (ref 22–32)
Calcium: 9 mg/dL (ref 8.9–10.3)
Chloride: 105 mmol/L (ref 98–111)
Creatinine, Ser: 1.01 mg/dL — ABNORMAL HIGH (ref 0.44–1.00)
GFR, Estimated: >60 mL/min (ref >60)
Glucose, Bld: 95 mg/dL (ref 70–99)
Potassium: 3.5 mmol/L (ref 3.5–5.1)
Sodium: 139 mmol/L (ref 135–145)

## 2022-10-30 LAB — TYPE AND SCREEN
ABO/RH(D): A POS
Antibody Screen: NEGATIVE

## 2022-10-30 LAB — CBC
HCT: 45.4 % (ref 36.0–46.0)
Hemoglobin: 14.3 g/dL (ref 12.0–15.0)
MCH: 29.5 pg (ref 26.0–34.0)
MCHC: 31.5 g/dL (ref 30.0–36.0)
MCV: 93.6 fL (ref 80.0–100.0)
Platelets: 239 10*3/uL (ref 150–400)
RBC: 4.85 MIL/uL (ref 3.87–5.11)
RDW: 14.6 % (ref 11.5–15.5)
WBC: 9 10*3/uL (ref 4.0–10.5)
nRBC: 0 % (ref 0.0–0.2)

## 2022-10-30 LAB — SURGICAL PCR SCREEN
MRSA, PCR: NEGATIVE
Staphylococcus aureus: NEGATIVE

## 2022-10-30 SURGERY — POSTERIOR LUMBAR FUSION 1 LEVEL
Anesthesia: General | Site: Back

## 2022-10-30 MED ORDER — ROCURONIUM BROMIDE 10 MG/ML (PF) SYRINGE
PREFILLED_SYRINGE | INTRAVENOUS | Status: DC | PRN
  Administered 2022-10-30: 20 mg via INTRAVENOUS
  Administered 2022-10-30: 100 mg via INTRAVENOUS

## 2022-10-30 MED ORDER — VANCOMYCIN HCL IN DEXTROSE 1-5 GM/200ML-% IV SOLN
1000.0000 mg | Freq: Once | INTRAVENOUS | Status: DC
  Filled 2022-10-30: qty 200

## 2022-10-30 MED ORDER — HYDROCORTISONE SOD SUC (PF) 100 MG IJ SOLR
INTRAMUSCULAR | Status: DC | PRN
  Administered 2022-10-30: 50 mg via INTRAVENOUS

## 2022-10-30 MED ORDER — LACTATED RINGERS IV SOLN: INTRAVENOUS | Status: DC

## 2022-10-30 MED ORDER — ONDANSETRON HCL 4 MG/2ML IJ SOLN: 4.0000 mg | Freq: Four times a day (QID) | INTRAMUSCULAR | Status: DC | PRN

## 2022-10-30 MED ORDER — 0.9 % SODIUM CHLORIDE (POUR BTL) OPTIME
TOPICAL | Status: DC | PRN
  Administered 2022-10-30: 1000 mL

## 2022-10-30 MED ORDER — OXYCODONE HCL 5 MG/5ML PO SOLN: 5.0000 mg | Freq: Once | ORAL | Status: AC | PRN

## 2022-10-30 MED ORDER — HYDROXYCHLOROQUINE SULFATE 200 MG PO TABS
200.0000 mg | ORAL_TABLET | Freq: Two times a day (BID) | ORAL | Status: DC
  Administered 2022-10-30 – 2022-10-31 (×2): 200 mg via ORAL
  Filled 2022-10-30 (×3): qty 1

## 2022-10-30 MED ORDER — FENTANYL CITRATE (PF) 250 MCG/5ML IJ SOLN
INTRAMUSCULAR | Status: DC | PRN
  Administered 2022-10-30 (×5): 50 ug via INTRAVENOUS
  Administered 2022-10-30: 100 ug via INTRAVENOUS

## 2022-10-30 MED ORDER — HYDROCODONE-ACETAMINOPHEN 10-325 MG PO TABS: 1.0000 | ORAL_TABLET | ORAL | Status: DC | PRN

## 2022-10-30 MED ORDER — FOLIC ACID 1 MG PO TABS
1.0000 mg | ORAL_TABLET | Freq: Every day | ORAL | Status: DC
  Administered 2022-10-31: 1 mg via ORAL
  Filled 2022-10-30: qty 1

## 2022-10-30 MED ORDER — BACLOFEN 20 MG PO TABS
20.0000 mg | ORAL_TABLET | Freq: Two times a day (BID) | ORAL | Status: DC
  Administered 2022-10-30 – 2022-10-31 (×2): 20 mg via ORAL
  Filled 2022-10-30 (×3): qty 1

## 2022-10-30 MED ORDER — PRAVASTATIN SODIUM 40 MG PO TABS
80.0000 mg | ORAL_TABLET | Freq: Every day | ORAL | Status: DC
  Administered 2022-10-31: 80 mg via ORAL
  Filled 2022-10-30: qty 2

## 2022-10-30 MED ORDER — OYSTER SHELL CALCIUM/D3 500-5 MG-MCG PO TABS
1.0000 | ORAL_TABLET | Freq: Every day | ORAL | Status: DC
  Administered 2022-10-30 – 2022-10-31 (×2): 1 via ORAL
  Filled 2022-10-30 (×2): qty 1

## 2022-10-30 MED ORDER — OXYCODONE HCL 5 MG PO TABS
ORAL_TABLET | ORAL | Status: AC
  Filled 2022-10-30: qty 1

## 2022-10-30 MED ORDER — ZIPRASIDONE HCL 40 MG PO CAPS
40.0000 mg | ORAL_CAPSULE | Freq: Every day | ORAL | Status: DC
  Administered 2022-10-30: 40 mg via ORAL
  Filled 2022-10-30 (×2): qty 1

## 2022-10-30 MED ORDER — OXYCODONE HCL 5 MG PO TABS
10.0000 mg | ORAL_TABLET | ORAL | Status: DC | PRN
  Administered 2022-10-30 – 2022-10-31 (×5): 10 mg via ORAL
  Filled 2022-10-30 (×5): qty 2

## 2022-10-30 MED ORDER — HYDROMORPHONE HCL 1 MG/ML IJ SOLN
INTRAMUSCULAR | Status: AC
  Filled 2022-10-30: qty 1

## 2022-10-30 MED ORDER — MEPERIDINE HCL 25 MG/ML IJ SOLN: 6.2500 mg | INTRAMUSCULAR | Status: DC | PRN

## 2022-10-30 MED ORDER — PHENOL 1.4 % MT LIQD: 1.0000 | OROMUCOSAL | Status: DC | PRN

## 2022-10-30 MED ORDER — HYDROMORPHONE HCL 1 MG/ML IJ SOLN
0.2500 mg | INTRAMUSCULAR | Status: DC | PRN
  Administered 2022-10-30 (×2): 0.5 mg via INTRAVENOUS

## 2022-10-30 MED ORDER — CYCLOSPORINE 0.05 % OP EMUL
1.0000 [drp] | Freq: Every day | OPHTHALMIC | Status: DC
  Administered 2022-10-31: 1 [drp] via OPHTHALMIC
  Filled 2022-10-30: qty 30

## 2022-10-30 MED ORDER — DULOXETINE HCL 30 MG PO CPEP
60.0000 mg | ORAL_CAPSULE | Freq: Every morning | ORAL | Status: DC
  Administered 2022-10-31: 60 mg via ORAL
  Filled 2022-10-30: qty 2

## 2022-10-30 MED ORDER — HYDROMORPHONE HCL 1 MG/ML IJ SOLN
0.2500 mg | INTRAMUSCULAR | Status: DC | PRN
  Administered 2022-10-30 (×4): 0.5 mg via INTRAVENOUS

## 2022-10-30 MED ORDER — MIDAZOLAM HCL 2 MG/2ML IJ SOLN
INTRAMUSCULAR | Status: DC | PRN
  Administered 2022-10-30: 1 mg via INTRAVENOUS

## 2022-10-30 MED ORDER — DIAZEPAM 5 MG PO TABS
ORAL_TABLET | ORAL | Status: AC
  Filled 2022-10-30: qty 1

## 2022-10-30 MED ORDER — PROPOFOL 10 MG/ML IV BOLUS
INTRAVENOUS | Status: DC | PRN
  Administered 2022-10-30: 150 mg via INTRAVENOUS

## 2022-10-30 MED ORDER — SUGAMMADEX SODIUM 200 MG/2ML IV SOLN
INTRAVENOUS | Status: DC | PRN
  Administered 2022-10-30: 200 mg via INTRAVENOUS

## 2022-10-30 MED ORDER — THROMBIN 20000 UNITS EX SOLR
CUTANEOUS | Status: AC
  Filled 2022-10-30: qty 20000

## 2022-10-30 MED ORDER — MELATONIN 5 MG PO TABS
10.0000 mg | ORAL_TABLET | Freq: Every day | ORAL | Status: DC
  Administered 2022-10-30: 10 mg via ORAL
  Filled 2022-10-30: qty 2

## 2022-10-30 MED ORDER — SODIUM CHLORIDE 0.9% FLUSH
3.0000 mL | Freq: Two times a day (BID) | INTRAVENOUS | Status: DC
  Administered 2022-10-30: 3 mL via INTRAVENOUS

## 2022-10-30 MED ORDER — ACETAMINOPHEN 650 MG RE SUPP: 650.0000 mg | RECTAL | Status: DC | PRN

## 2022-10-30 MED ORDER — HYDROXYZINE HCL 10 MG PO TABS
10.0000 mg | ORAL_TABLET | Freq: Every day | ORAL | Status: DC
  Administered 2022-10-30: 10 mg via ORAL
  Filled 2022-10-30 (×2): qty 1

## 2022-10-30 MED ORDER — PROMETHAZINE HCL 25 MG/ML IJ SOLN: 6.2500 mg | INTRAMUSCULAR | Status: DC | PRN

## 2022-10-30 MED ORDER — GABAPENTIN 100 MG PO CAPS
100.0000 mg | ORAL_CAPSULE | Freq: Three times a day (TID) | ORAL | Status: DC
  Administered 2022-10-30 – 2022-10-31 (×3): 100 mg via ORAL
  Filled 2022-10-30 (×3): qty 1

## 2022-10-30 MED ORDER — ALBUTEROL SULFATE HFA 108 (90 BASE) MCG/ACT IN AERS: 1.0000 | INHALATION_SPRAY | Freq: Four times a day (QID) | RESPIRATORY_TRACT | Status: DC | PRN

## 2022-10-30 MED ORDER — ACETAMINOPHEN 325 MG PO TABS
650.0000 mg | ORAL_TABLET | ORAL | Status: DC | PRN
  Administered 2022-10-30 – 2022-10-31 (×3): 650 mg via ORAL
  Filled 2022-10-30 (×3): qty 2

## 2022-10-30 MED ORDER — OMEGA-3-ACID ETHYL ESTERS 1 G PO CAPS
1.0000 g | ORAL_CAPSULE | Freq: Every day | ORAL | Status: DC
  Administered 2022-10-31: 1 g via ORAL
  Filled 2022-10-30: qty 1

## 2022-10-30 MED ORDER — BUSPIRONE HCL 10 MG PO TABS
10.0000 mg | ORAL_TABLET | Freq: Two times a day (BID) | ORAL | Status: DC
  Administered 2022-10-30 – 2022-10-31 (×2): 10 mg via ORAL
  Filled 2022-10-30 (×2): qty 1

## 2022-10-30 MED ORDER — LACTATED RINGERS IV SOLN: INTRAVENOUS | Status: DC | PRN

## 2022-10-30 MED ORDER — VITAMIN D (ERGOCALCIFEROL) 1.25 MG (50000 UNIT) PO CAPS: 50000.0000 [IU] | ORAL_CAPSULE | ORAL | Status: DC

## 2022-10-30 MED ORDER — AMISULPRIDE (ANTIEMETIC) 5 MG/2ML IV SOLN: 10.0000 mg | Freq: Once | INTRAVENOUS | Status: DC | PRN

## 2022-10-30 MED ORDER — CHLORHEXIDINE GLUCONATE CLOTH 2 % EX PADS: 6.0000 | MEDICATED_PAD | Freq: Once | CUTANEOUS | Status: DC

## 2022-10-30 MED ORDER — PROMETHAZINE HCL 25 MG PO TABS: 25.0000 mg | ORAL_TABLET | Freq: Four times a day (QID) | ORAL | Status: DC | PRN

## 2022-10-30 MED ORDER — LIDOCAINE 2% (20 MG/ML) 5 ML SYRINGE
INTRAMUSCULAR | Status: DC | PRN
  Administered 2022-10-30: 100 mg via INTRAVENOUS

## 2022-10-30 MED ORDER — HYDROMORPHONE HCL 1 MG/ML IJ SOLN: 1.0000 mg | INTRAMUSCULAR | Status: DC | PRN

## 2022-10-30 MED ORDER — PANTOPRAZOLE SODIUM 40 MG PO TBEC
40.0000 mg | DELAYED_RELEASE_TABLET | Freq: Every day | ORAL | Status: DC
  Administered 2022-10-31: 40 mg via ORAL
  Filled 2022-10-30: qty 1

## 2022-10-30 MED ORDER — PROPOFOL 10 MG/ML IV BOLUS
INTRAVENOUS | Status: AC
  Filled 2022-10-30: qty 20

## 2022-10-30 MED ORDER — THROMBIN 20000 UNITS EX SOLR
CUTANEOUS | Status: DC | PRN
  Administered 2022-10-30: 20 mL via TOPICAL

## 2022-10-30 MED ORDER — FENTANYL CITRATE (PF) 250 MCG/5ML IJ SOLN
INTRAMUSCULAR | Status: AC
  Filled 2022-10-30: qty 5

## 2022-10-30 MED ORDER — DIPHENHYDRAMINE HCL 50 MG/ML IJ SOLN
INTRAMUSCULAR | Status: DC | PRN
  Administered 2022-10-30: 25 mg via INTRAVENOUS

## 2022-10-30 MED ORDER — SODIUM CHLORIDE 0.9 % IV SOLN
250.0000 mL | INTRAVENOUS | Status: DC
  Administered 2022-10-30: 250 mL via INTRAVENOUS

## 2022-10-30 MED ORDER — DIAZEPAM 5 MG PO TABS
5.0000 mg | ORAL_TABLET | Freq: Four times a day (QID) | ORAL | Status: DC | PRN
  Administered 2022-10-30 – 2022-10-31 (×2): 5 mg via ORAL
  Filled 2022-10-30: qty 1

## 2022-10-30 MED ORDER — BUPIVACAINE HCL (PF) 0.25 % IJ SOLN
INTRAMUSCULAR | Status: AC
  Filled 2022-10-30: qty 30

## 2022-10-30 MED ORDER — CHLORHEXIDINE GLUCONATE 0.12 % MT SOLN
15.0000 mL | OROMUCOSAL | Status: AC
  Administered 2022-10-30: 15 mL via OROMUCOSAL
  Filled 2022-10-30: qty 15

## 2022-10-30 MED ORDER — ONDANSETRON HCL 4 MG PO TABS: 4.0000 mg | ORAL_TABLET | Freq: Four times a day (QID) | ORAL | Status: DC | PRN

## 2022-10-30 MED ORDER — MENTHOL 3 MG MT LOZG: 1.0000 | LOZENGE | OROMUCOSAL | Status: DC | PRN

## 2022-10-30 MED ORDER — MIDAZOLAM HCL 2 MG/2ML IJ SOLN
INTRAMUSCULAR | Status: AC
  Filled 2022-10-30: qty 2

## 2022-10-30 MED ORDER — POLYETHYLENE GLYCOL 3350 17 G PO PACK: 17.0000 g | PACK | Freq: Every day | ORAL | Status: DC | PRN

## 2022-10-30 MED ORDER — BISACODYL 10 MG RE SUPP: 10.0000 mg | Freq: Every day | RECTAL | Status: DC | PRN

## 2022-10-30 MED ORDER — DEXAMETHASONE SODIUM PHOSPHATE 10 MG/ML IJ SOLN
INTRAMUSCULAR | Status: DC | PRN
  Administered 2022-10-30: 8 mg via INTRAVENOUS

## 2022-10-30 MED ORDER — METHOTREXATE SODIUM 2.5 MG PO TABS
10.0000 mg | ORAL_TABLET | ORAL | Status: DC
  Administered 2022-10-30: 10 mg via ORAL
  Filled 2022-10-30: qty 4

## 2022-10-30 MED ORDER — LOSARTAN POTASSIUM 50 MG PO TABS
50.0000 mg | ORAL_TABLET | Freq: Every day | ORAL | Status: DC
  Administered 2022-10-30 – 2022-10-31 (×2): 50 mg via ORAL
  Filled 2022-10-30 (×2): qty 1

## 2022-10-30 MED ORDER — ALUM & MAG HYDROXIDE-SIMETH 200-200-20 MG/5ML PO SUSP
30.0000 mL | ORAL | Status: DC | PRN
  Administered 2022-10-30: 30 mL via ORAL
  Filled 2022-10-30: qty 30

## 2022-10-30 MED ORDER — VANCOMYCIN HCL 1000 MG IV SOLR
INTRAVENOUS | Status: AC
  Filled 2022-10-30: qty 20

## 2022-10-30 MED ORDER — BUPIVACAINE HCL (PF) 0.25 % IJ SOLN
INTRAMUSCULAR | Status: DC | PRN
  Administered 2022-10-30: 20 mL

## 2022-10-30 MED ORDER — VANCOMYCIN HCL 1500 MG/300ML IV SOLN
1500.0000 mg | INTRAVENOUS | Status: AC
  Administered 2022-10-30: 1500 mg via INTRAVENOUS
  Filled 2022-10-30 (×2): qty 300

## 2022-10-30 MED ORDER — FLEET ENEMA 7-19 GM/118ML RE ENEM: 1.0000 | ENEMA | Freq: Once | RECTAL | Status: DC | PRN

## 2022-10-30 MED ORDER — OXYCODONE HCL 5 MG PO TABS
5.0000 mg | ORAL_TABLET | Freq: Once | ORAL | Status: AC | PRN
  Administered 2022-10-30: 5 mg via ORAL

## 2022-10-30 MED ORDER — SODIUM CHLORIDE 0.9% FLUSH: 3.0000 mL | INTRAVENOUS | Status: DC | PRN

## 2022-10-30 MED ORDER — TRAZODONE HCL 150 MG PO TABS
300.0000 mg | ORAL_TABLET | Freq: Every day | ORAL | Status: DC
  Administered 2022-10-30: 300 mg via ORAL
  Filled 2022-10-30: qty 2

## 2022-10-30 MED ORDER — VANCOMYCIN HCL 1000 MG IV SOLR
INTRAVENOUS | Status: DC | PRN
  Administered 2022-10-30: 1000 mg

## 2022-10-30 MED ORDER — ONDANSETRON HCL 4 MG/2ML IJ SOLN
INTRAMUSCULAR | Status: DC | PRN
  Administered 2022-10-30: 4 mg via INTRAVENOUS

## 2022-10-30 MED ORDER — DEXAMETHASONE 0.5 MG PO TABS
0.7500 mg | ORAL_TABLET | Freq: Every morning | ORAL | Status: DC
  Administered 2022-10-31: 0.75 mg via ORAL
  Filled 2022-10-30: qty 1.5
  Filled 2022-10-30: qty 1

## 2022-10-30 SURGICAL SUPPLY — 68 items
ADH SKN CLS APL DERMABOND .7 (GAUZE/BANDAGES/DRESSINGS) ×1
APL SKNCLS STERI-STRIP NONHPOA (GAUZE/BANDAGES/DRESSINGS) ×1
BAG COUNTER SPONGE SURGICOUNT (BAG) ×1 IMPLANT
BAG DECANTER FOR FLEXI CONT (MISCELLANEOUS) ×1 IMPLANT
BAG SPNG CNTER NS LX DISP (BAG) ×2
BENZOIN TINCTURE PRP APPL 2/3 (GAUZE/BANDAGES/DRESSINGS) ×1 IMPLANT
BLADE BONE MILL MEDIUM (MISCELLANEOUS) ×1 IMPLANT
BLADE CLIPPER SURG (BLADE) IMPLANT
BUR CUTTER 7.0 ROUND (BURR) IMPLANT
BUR MATCHSTICK NEURO 3.0 LAGG (BURR) ×1 IMPLANT
CAGE EXP CATALYFT SHORT 9X22.5 (Cage) IMPLANT
CANISTER SUCT 3000ML PPV (MISCELLANEOUS) ×1 IMPLANT
CAP LCK SPNE (Orthopedic Implant) ×4 IMPLANT
CAP LOCK SPINE RADIUS (Orthopedic Implant) IMPLANT
CAP LOCKING (Orthopedic Implant) ×4 IMPLANT
CNTNR URN SCR LID CUP LEK RST (MISCELLANEOUS) ×1 IMPLANT
CONT SPEC 4OZ STRL OR WHT (MISCELLANEOUS) ×1
COVER BACK TABLE 60X90IN (DRAPES) ×1 IMPLANT
DERMABOND ADVANCED .7 DNX12 (GAUZE/BANDAGES/DRESSINGS) ×1 IMPLANT
DRAPE C-ARM 42X72 X-RAY (DRAPES) ×2 IMPLANT
DRAPE HALF SHEET 40X57 (DRAPES) IMPLANT
DRAPE LAPAROTOMY 100X72X124 (DRAPES) ×1 IMPLANT
DRAPE SURG 17X23 STRL (DRAPES) ×4 IMPLANT
DRSG OPSITE POSTOP 4X6 (GAUZE/BANDAGES/DRESSINGS) ×1 IMPLANT
DURAPREP 26ML APPLICATOR (WOUND CARE) ×1 IMPLANT
ELECT REM PT RETURN 9FT ADLT (ELECTROSURGICAL) ×1
ELECTRODE REM PT RTRN 9FT ADLT (ELECTROSURGICAL) ×1 IMPLANT
EVACUATOR 1/8 PVC DRAIN (DRAIN) IMPLANT
GAUZE 4X4 16PLY ~~LOC~~+RFID DBL (SPONGE) IMPLANT
GAUZE SPONGE 4X4 12PLY STRL (GAUZE/BANDAGES/DRESSINGS) IMPLANT
GLOVE BIO SURGEON STRL SZ 6.5 (GLOVE) ×1 IMPLANT
GLOVE BIOGEL PI IND STRL 6.5 (GLOVE) ×1 IMPLANT
GLOVE BIOGEL PI IND STRL 7.5 (GLOVE) IMPLANT
GLOVE ECLIPSE 9.0 STRL (GLOVE) ×2 IMPLANT
GLOVE EXAM NITRILE XL STR (GLOVE) IMPLANT
GLOVE SS PI 9.0 STRL (GLOVE) IMPLANT
GLOVE SURG SS PI 6.5 STRL IVOR (GLOVE) IMPLANT
GLOVE SURG SS PI 7.0 STRL IVOR (GLOVE) IMPLANT
GOWN STRL REUS W/ TWL LRG LVL3 (GOWN DISPOSABLE) IMPLANT
GOWN STRL REUS W/ TWL XL LVL3 (GOWN DISPOSABLE) ×2 IMPLANT
GOWN STRL REUS W/TWL 2XL LVL3 (GOWN DISPOSABLE) IMPLANT
GOWN STRL REUS W/TWL LRG LVL3 (GOWN DISPOSABLE) ×1
GOWN STRL REUS W/TWL XL LVL3 (GOWN DISPOSABLE) ×2
KIT BASIN OR (CUSTOM PROCEDURE TRAY) ×1 IMPLANT
KIT TURNOVER KIT B (KITS) ×1 IMPLANT
NEEDLE HYPO 22GX1.5 SAFETY (NEEDLE) ×1 IMPLANT
NS IRRIG 1000ML POUR BTL (IV SOLUTION) ×1 IMPLANT
PACK LAMINECTOMY NEURO (CUSTOM PROCEDURE TRAY) ×1 IMPLANT
PUTTY GRAFTON DBF 6CC W/DELIVE (Putty) IMPLANT
ROD 5.5X60MM GREEN (Rod) IMPLANT
ROD 70MM (Rod) ×1 IMPLANT
ROD SPNL 70X5.5XNS TI RDS (Rod) IMPLANT
SCREW POLYAXIAL 5.5X45MM (Screw) IMPLANT
SET SCREW (Screw) ×2 IMPLANT
SET SCREW VRST (Screw) IMPLANT
SPIKE FLUID TRANSFER (MISCELLANEOUS) ×1 IMPLANT
SPONGE SURGIFOAM ABS GEL 100 (HEMOSTASIS) ×1 IMPLANT
STRIP CLOSURE SKIN 1/2X4 (GAUZE/BANDAGES/DRESSINGS) ×2 IMPLANT
SUT VIC AB 0 CT1 18XCR BRD8 (SUTURE) ×2 IMPLANT
SUT VIC AB 0 CT1 8-18 (SUTURE) ×2
SUT VIC AB 2-0 CT1 18 (SUTURE) ×1 IMPLANT
SUT VIC AB 3-0 SH 8-18 (SUTURE) ×2 IMPLANT
TOWEL GREEN STERILE (TOWEL DISPOSABLE) ×1 IMPLANT
TOWEL GREEN STERILE FF (TOWEL DISPOSABLE) ×1 IMPLANT
TRAY FOL W/BAG SLVR 16FR STRL (SET/KITS/TRAYS/PACK) IMPLANT
TRAY FOLEY MTR SLVR 16FR STAT (SET/KITS/TRAYS/PACK) ×1 IMPLANT
TRAY FOLEY W/BAG SLVR 16FR LF (SET/KITS/TRAYS/PACK) ×1
WATER STERILE IRR 1000ML POUR (IV SOLUTION) ×1 IMPLANT

## 2022-10-30 NOTE — Op Note (Signed)
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    Posterior lumbar to body fusion utilizing interbody cages, local harvested autograft and morselized allograft  L3-4 posterior lateral arthrodesis utilizing L3-4-5 pedicle screw instrumentation  Surgeon:Lacharles Altschuler A.Aubrielle Stroud, M.D.  Asst. Surgeon: Reinaldo Meeker, NP  Anesthesia: General  Indication: 67 year old female remotely status post L4-5 decompression fusion surgery presents now with worsening back pain and bilateral anterior thigh symptoms left greater than right failing conservative management.  Workup demonstrates evidence of adjacent segment disc degeneration with leftward L3-4 disc herniation and left L4 nerve root compression and the L3-4 foraminal extraforaminal disc protrusion and right L3 nerve root compression.  Patient presents now for L3-4 decompression and fusion in hopes improving her symptoms.  Operative note: After induction of anesthesia, patient positioned prone onto a Wilson frame appropriate padded.  Lumbar region prepped and draped sterilely.  Incision made overlying L3-4-5.  Dissection performed bilaterally.  Retractor placed.  Fluoroscopy used.  Levels confirmed.  Previously placed pedicle screws potation L4-5 was disassembled.  Fusion was inspected and found to be solid but the screws were left in place.  Attention then placed to L3-4.  Decompressive laminotomies and facetectomies were then performed bilaterally using Leksell rongeurs Kerrison rongeurs, and high-speed drill to remove the inferior two thirds the lamina of L3 and the entire inferior facet and pars interarticularis of L3 bilaterally, the majority of the superior facets of L4  bilaterally and the superior rim of the L4 lamina.  Ligament flavum elevated and resected.  Wide foraminotomies complete on the course exiting L3 and L4 nerve roots.  Bilateral discectomies were then performed at L3-4 and L4-5 including elements of the disc herniation.  Disc base was then prepared for interbody fusion.  With the distractor placed patient's right side disc base was then prepared on the left.  A 9 mm expandable Medtronic cage was then impacted into place and expanded.  On the right side the distractor was removed.  The space was further cleaned.  Morselized autograft was packed in the interspace.  A second cage was then impacted in the place and expanded.  Pedicles of L3 were then identified using surface landmarks and intraoperative fluoroscopy.  Superficial bone around the pedicle was then removed using high-speed drill.  Pedicle was then probed using a pedicle all each pedicle tract was then probed and found to be solidly within the bone.  5.5 mm Stryker Everest screws were placed bilaterally at L3.  Final images revealed good position of the bone graft hardware and cages.  Alignment of the spine was normal.  Wound was then irrigated.  Gelfoam was placed topically for hemostasis.  Each cage was packed with demineralized bone fibers.  Short segment titanium rod placed over the screws at L3-4-5.  Locking caps placed over the screws.  Locking caps then engaged with the construct under compression.  Transverse processes of L3 and L4 were decorticated.  Morselized autograft was packed posterolaterally for later fusion.  Vancomycin powder was placed in the deep wound space.  Wound is then closed in layers with Vicryl sutures.  Steri-Strips and sterile dressing were applied.  No apparent complications.  Patient tolerated the procedure well and she returns to the recovery room postop.

## 2022-10-30 NOTE — Brief Op Note (Signed)
10/30/2022  12:30 PM  PATIENT:  Felicia Ponce  67 y.o. female  PRE-OPERATIVE DIAGNOSIS:  Stenosis  POST-OPERATIVE DIAGNOSIS:  Stenosis  PROCEDURE:  Procedure(s): Posterior Lumbar Interbody Fusion  - Lumbar three-Lumbar four (N/A)  SURGEON:  Surgeon(s) and Role:    Earnie Larsson, MD - Primary  PHYSICIAN ASSISTANT:   ASSISTANTSMearl Latin   ANESTHESIA:   general  EBL:  100 mL   BLOOD ADMINISTERED:none  DRAINS: none   LOCAL MEDICATIONS USED:  MARCAINE     SPECIMEN:  No Specimen  DISPOSITION OF SPECIMEN:  N/A  COUNTS:  YES  TOURNIQUET:  * No tourniquets in log *  DICTATION: .Dragon Dictation  PLAN OF CARE: Admit for overnight observation  PATIENT DISPOSITION:  PACU - hemodynamically stable.   Delay start of Pharmacological VTE agent (>24hrs) due to surgical blood loss or risk of bleeding: yes

## 2022-10-30 NOTE — Anesthesia Postprocedure Evaluation (Signed)
Anesthesia Post Note  Patient: Felicia Ponce  Procedure(s) Performed: Posterior Lumbar Interbody Fusion  - Lumbar three-Lumbar four (Back)     Patient location during evaluation: PACU Anesthesia Type: General Level of consciousness: awake and alert Pain management: pain level controlled Vital Signs Assessment: post-procedure vital signs reviewed and stable Respiratory status: spontaneous breathing, nonlabored ventilation and respiratory function stable Cardiovascular status: blood pressure returned to baseline and stable Postop Assessment: no apparent nausea or vomiting Anesthetic complications: no   No notable events documented.  Last Vitals:  Vitals:   10/30/22 1345 10/30/22 1400  BP: (!) 134/109 (!) 122/58  Pulse: 89 82  Resp: (!) 21 17  Temp: (!) 36.2 C 36.7 C  SpO2: 97% 93%    Last Pain:  Vitals:   10/30/22 1400  TempSrc:   PainSc: Asleep                 Lynda Rainwater

## 2022-10-30 NOTE — Transfer of Care (Signed)
Immediate Anesthesia Transfer of Care Note  Patient: Felicia Ponce  Procedure(s) Performed: Posterior Lumbar Interbody Fusion  - Lumbar three-Lumbar four (Back)  Patient Location: PACU  Anesthesia Type:General  Level of Consciousness: awake, alert , and oriented  Airway & Oxygen Therapy: Patient Spontanous Breathing and Patient connected to face mask oxygen  Post-op Assessment: Report given to RN, Post -op Vital signs reviewed and stable, and Patient moving all extremities X 4  Post vital signs: Reviewed and stable  Last Vitals:  Vitals Value Taken Time  BP 143/60 10/30/22 1250  Temp    Pulse 86 10/30/22 1257  Resp 24 10/30/22 1257  SpO2 98 % 10/30/22 1257  Vitals shown include unvalidated device data.  Last Pain:  Vitals:   10/30/22 0737  TempSrc:   PainSc: 0-No pain      Patients Stated Pain Goal: 0 (27/07/86 7544)  Complications: No notable events documented.

## 2022-10-30 NOTE — H&P (Signed)
Felicia Ponce is an 67 y.o. female.   Chief Complaint: Back pain HPI: 67 year old female remotely status post L4-5 decompression fusion presents with severe back and right lower extremity radicular pain consistent with a right-sided L4 radiculopathy.  Symptoms of failed conservative management.  Workup demonstrates evidence of progressive adjacent level degeneration with associated disc herniation on the right at L3-4 with ongoing significant L4 nerve root compression.  Patient presents now for L3-4 for decompression and fusion surgery in hopes improving her symptoms.  Past Medical History:  Diagnosis Date   Adrenal insufficiency (HCC)    Arthritis    Cervical stenosis of spine    Depression    Fibromyalgia    GERD (gastroesophageal reflux disease)    History of hiatal hernia    Hypertension    Hyperthyroidism    Hypoglycemia    Myocardial infarction ([place])    mild in 2011, is not required to see cardiology now   Pneumonia    Pre-diabetes    related to long term steroid use   Sleep apnea    Spondylolisthesis of lumbar region    TIA (transient ischemic attack)     Past Surgical History:  Procedure Laterality Date   ABDOMINAL HYSTERECTOMY     ANTERIOR CERVICAL DECOMP/DISCECTOMY FUSION N/A 09/26/2018   Procedure: Anterior Cervical Discectomy Fusion Cervical Four-Cervical Five - Cervical Five-Cervical Six - Cervical Six-Cervical Seven;  Surgeon: Earnie Larsson, MD;  Location: [facility];  Service: Neurosurgery;  Laterality: N/A;  Anterior Cervical Discectomy Fusion Cervical Four-Cervical Five - Cervical Five-Cervical Six - Cervical Six-Cervical Seven   APPENDECTOMY     BACK SURGERY     CARDIAC CATHETERIZATION     CHOLECYSTECTOMY     COLONOSCOPY     DILATION AND CURETTAGE OF UTERUS     EYE SURGERY     bilateral cataract removal and lasix   JOINT REPLACEMENT Left    knee replacement- howe   OOPHORECTOMY     Bilateral   OVARIAN CYST REMOVAL     TONSILLECTOMY      History  reviewed. No pertinent family history. Social History:  reports that she has never smoked. She has never used smokeless tobacco. She reports that she does not drink alcohol and does not use drugs.  Allergies:  Allergies  Allergen Reactions   Penicillins Rash and Other (See Comments)    PATIENT HAS HAD A PCN REACTION WITH IMMEDIATE RASH, FACIAL/TONGUE/THROAT SWELLING, SOB, OR LIGHTHEADEDNESS WITH HYPOTENSION:  #  #  #  YES  #  #  #   HAS PT DEVELOPED SEVERE RASH INVOLVING MUCUS MEMBRANES or SKIN NECROSIS: #  #  #  YES  #  #  #   Has patient had a PCN reaction that required hospitalization: No Has patient had a PCN reaction occurring within the last 10 years: No     Ciprofloxacin Nausea Only, Rash and Other (See Comments)    DIZZINESS   Milnacipran Anxiety, Rash and Other (See Comments)    DIZZINESS, WEAKNESS   Nsaids Rash and Other (See Comments)    ULCERS    Other Other (See Comments)    Chlorine water Acidy Food causes GI upset NO Soy, wheat, gluten, corn products   Savella [Milnacipran Hcl] Other (See Comments)    UNSPECIFIED REACTION    Tilactase Other (See Comments)    UNSPECIFIED REACTION    Aspirin Rash   Latex Rash   Pollen Extract Other (See Comments)    Stuffy nose  Sulfa Antibiotics Rash   Sulfamethoxazole-Trimethoprim Rash   Sulfasalazine Rash   Tape Rash and Other (See Comments)    Can use paper tape   Tolmetin Rash    Medications Prior to Admission  Medication Sig Dispense Refill   baclofen (LIORESAL) 20 MG tablet Take 20 mg by mouth 2 (two) times daily.     busPIRone (BUSPAR) 10 MG tablet Take 10 mg by mouth 2 (two) times daily.     CALCIUM-MAGNESIUM PO Take 1 tablet by mouth daily. Cetyl Myristoleate     dexamethasone (DECADRON) 0.75 MG tablet Take 0.75 mg by mouth every morning.     DULoxetine (CYMBALTA) 60 MG capsule Take 60 mg by mouth every morning.     ergocalciferol (VITAMIN D2) 1.25 MG (50000 UT) capsule Take 50,000 Units by mouth every Tuesday.      esomeprazole (NEXIUM) 20 MG capsule Take 20 mg by mouth 2 (two) times daily before a meal.     folic acid (FOLVITE) 1 MG tablet Take 1 mg by mouth daily.     gabapentin (NEURONTIN) 100 MG capsule Take 100 mg by mouth 3 (three) times daily.     hydroxychloroquine (PLAQUENIL) 200 MG tablet Take 200 mg by mouth 2 (two) times daily.     hydrOXYzine (ATARAX) 10 MG tablet Take 10 mg by mouth at bedtime.     losartan (COZAAR) 50 MG tablet Take 50 mg by mouth daily.     Melatonin 5 MG CAPS Take 10 mg by mouth at bedtime.      methotrexate (RHEUMATREX) 2.5 MG tablet Take 10 mg by mouth once a week. Friday     Omega-3 Fatty Acids (FISH OIL) 1000 MG CAPS Take 1,000 mg by mouth daily.     oxyCODONE-acetaminophen (PERCOCET) 10-325 MG tablet Take 0.5-1 tablets by mouth 4 (four) times daily.     OZEMPIC, 1 MG/DOSE, 4 MG/3ML SOPN Inject 1 mg as directed once a week.     polyethylene glycol (MIRALAX / GLYCOLAX) 17 g packet Take 17 g by mouth daily as needed for mild constipation or moderate constipation.     pravastatin (PRAVACHOL) 80 MG tablet Take 80 mg by mouth daily.     PRESCRIPTION MEDICATION Take 1 tablet by mouth 2 (two) times daily. E.Q stool softener with laxatives     promethazine (PHENERGAN) 25 MG tablet Take 25 mg by mouth every 6 (six) hours as needed for nausea or vomiting.     RESTASIS 0.05 % ophthalmic emulsion Place 1 drop into both eyes daily.  11   traZODone (DESYREL) 100 MG tablet Take 300 mg by mouth at bedtime.     ziprasidone (GEODON) 40 MG capsule Take 40 mg by mouth at bedtime.     albuterol (PROVENTIL HFA;VENTOLIN HFA) 108 (90 Base) MCG/ACT inhaler Inhale 1-2 puffs into the lungs every 6 (six) hours as needed for wheezing or shortness of breath.      Results for orders placed or performed during the hospital encounter of 10/30/22 (from the past 48 hour(s))  Type and screen     Status: None   Collection Time: 10/30/22  7:45 AM  Result Value Ref Range   ABO/RH(D) A POS     Antibody Screen NEG    Sample Expiration      11/02/2022,2359 Performed at [facility], [address], [place], [place] [address]   CBC     Status: None   Collection Time: 10/30/22  7:53 AM  Result Value Ref  Range   WBC 9.0 4.0 - 10.5 K/uL   RBC 4.85 3.87 - 5.11 MIL/uL   Hemoglobin 14.3 12.0 - 15.0 g/dL   HCT 45.4 36.0 - 46.0 %   MCV 93.6 80.0 - 100.0 fL   MCH 29.5 26.0 - 34.0 pg   MCHC 31.5 30.0 - 36.0 g/dL   RDW 14.6 11.5 - 15.5 %   Platelets 239 150 - 400 K/uL   nRBC 0.0 0.0 - 0.2 %    Comment: Performed at [facility], [place] [address], [place], [place] [address]  Basic metabolic panel     Status: Abnormal   Collection Time: 10/30/22  7:53 AM  Result Value Ref Range   Sodium 139 135 - 145 mmol/L   Potassium 3.5 3.5 - 5.1 mmol/L   Chloride 105 98 - 111 mmol/L   CO2 21 (L) 22 - 32 mmol/L   Glucose, Bld 95 70 - 99 mg/dL    Comment: Glucose reference range applies only to samples taken after fasting for at least 8 hours.   BUN 19 8 - 23 mg/dL   Creatinine, Ser 1.01 (H) 0.44 - 1.00 mg/dL   Calcium 9.0 8.9 - 10.3 mg/dL   GFR, Estimated >60 >60 mL/min    Comment: (NOTE) Calculated using the CKD-EPI Creatinine Equation (2021)    Anion gap 13 5 - 15    Comment: Performed at [facility] [address], [place], [place] [address]   No results found.  Pertinent items noted in HPI and remainder of comprehensive ROS otherwise negative.  Blood pressure (!) 158/78, pulse 82, temperature 97.9 F (36.6 C), temperature source Oral, resp. rate 18, height 5\' 4"  (1.626 m), weight 96.6 kg, SpO2 92 %.  Patient is awake and alert.  She is oriented and appropriate.  Speech is fluent.  Judgment insight are intact.  Cranial nerve function normal bilateral.  Motor examination reveals some mild weakness of her right quadriceps muscle group otherwise motor strength intact.  Sensory examination with decrease sensation to pinprick and light touch in her right L4 dermatome.   Reflexes are normal active except her Achilles reflexes are absent bilaterally.  Gait is antalgic.  Posture is mildly flexed.  Condition head ears eyes nose and throat is unremarked.  Chest and abdomen are benign.  Extremities are free of major deformity. Assessment/Plan L3-4 adjacent segment disease with significant disc herniation ongoing radiculopathy.  Plan bilateral L3-4 decompressive laminotomies and foraminotomies with L3-4 posterior lumbar body fusion utilizing interbody cages, local harvested autograft, and coupled with posterior arthrodesis utilizing nonsegmental pedicle screw fixation and local autograft.  Risks and benefits been explained.  Patient wishes to proceed.  Mallie Mussel A Saurav Crumble 10/30/2022, 9:52 AM

## 2022-10-30 NOTE — Progress Notes (Signed)
Orthopedic Tech Progress Note Patient Details:  Felicia Ponce Aug 25, 1956 [id number]  Ortho Devices Type of Ortho Device: Lumbar corsett Ortho Device/Splint Interventions: Ordered     LSO dropped off with 3C RN. Vernona Rieger 10/30/2022, 3:47 PM

## 2022-10-30 NOTE — Anesthesia Procedure Notes (Signed)
Procedure Name: Intubation Date/Time: 10/30/2022 10:31 AM  Performed by: Mariea Clonts, CRNAPre-anesthesia Checklist: Patient identified, Emergency Drugs available, Suction available and Patient being monitored Patient Re-evaluated:Patient Re-evaluated prior to induction Oxygen Delivery Method: Circle System Utilized Preoxygenation: Pre-oxygenation with 100% oxygen Induction Type: IV induction Ventilation: Mask ventilation without difficulty and Oral airway inserted - appropriate to patient size Laryngoscope Size: Glidescope and 3 Grade View: Grade I Tube type: Oral Tube size: 7.5 mm Number of attempts: 1 Airway Equipment and Method: Stylet and Oral airway Placement Confirmation: ETT inserted through vocal cords under direct vision, positive ETCO2 and breath sounds checked- equal and bilateral Tube secured with: Tape Dental Injury: Teeth and Oropharynx as per pre-operative assessment

## 2022-10-31 DIAGNOSIS — M5126 Other intervertebral disc displacement, lumbar region: Secondary | ICD-10-CM | POA: Diagnosis not present

## 2022-10-31 MED ORDER — CLINDAMYCIN HCL 300 MG PO CAPS: 300.0000 mg | ORAL_CAPSULE | Freq: Three times a day (TID) | ORAL | 0 refills | Status: AC

## 2022-10-31 MED ORDER — OXYCODONE HCL 10 MG PO TABS: 10.0000 mg | ORAL_TABLET | ORAL | 0 refills | Status: DC | PRN

## 2022-10-31 NOTE — Evaluation (Signed)
Physical Therapy Evaluation Patient Details Name: Felicia Ponce MRN: [id number] DOB: 1956-07-14 Today's Date: 10/31/2022  History of Present Illness  Pt is a 67 y.o. female s/p posterior lumbar intermody cusion L3-4. PMH significant for back surgery, ACDF (2019), GERD, fibromyalgia, HTN, MI (2011), pre-diabetes, TIA.  Clinical Impression  Patient evaluated by Physical Therapy with no further acute PT needs identified. All education has been completed and the patient has no further questions. Completed gait, transfer, stair, and log roll training. Husband present, supportive and actively involved with session. No evidence of LE buckling or overt LOB while ambulating with use of RW for support. Husband assisted with stairs (posterior approach with RW). All questions answered and family feels good about d/c to home. See below for any follow-up Physical Therapy or equipment needs. PT is signing off. Thank you for this referral.        Recommendations for follow up therapy are one component of a multi-disciplinary discharge planning process, led by the attending physician.  Recommendations may be updated based on patient status, additional functional criteria and insurance authorization.  Follow Up Recommendations Outpatient PT (Once cleared by surgeon.)      Assistance Recommended at Discharge Intermittent Supervision/Assistance  Patient can return home with the following  A little help with walking and/or transfers;A little help with bathing/dressing/bathroom;Assistance with cooking/housework;Help with stairs or ramp for entrance;Assist for transportation    Equipment Recommendations None recommended by PT  Recommendations for Other Services       Functional Status Assessment Patient has had a recent decline in their functional status and demonstrates the ability to make significant improvements in function in a reasonable and predictable amount of time.     Precautions / Restrictions  Precautions Precautions: Back Precaution Booklet Issued: Yes (comment) Precaution Comments: All precautions reviewed within the context of ADL Required Braces or Orthoses: Spinal Brace Spinal Brace: Lumbar corset;Applied in sitting position Restrictions Weight Bearing Restrictions: No      Mobility  Bed Mobility Overal bed mobility: Needs Assistance Bed Mobility: Rolling, Sidelying to Sit, Sit to Sidelying Rolling: Min guard Sidelying to sit: Min guard     Sit to sidelying: Min assist General bed mobility comments: Min guard with cues for technique rolling and rising to EOB but no assist required physically. Returning to bed, husband provides support for LEs. Wife cued for appropriate sidelying transition.    Transfers Overall transfer level: Needs assistance Equipment used: Rolling walker (2 wheels) Transfers: Sit to/from Stand Sit to Stand: Min guard           General transfer comment: min guard A for safety. Good stability once upright with RW. Cues for hand placement.    Ambulation/Gait Ambulation/Gait assistance: Supervision Gait Distance (Feet): 120 Feet Assistive device: Rolling walker (2 wheels) Gait Pattern/deviations: Step-through pattern, Decreased stride length Gait velocity: decr Gait velocity interpretation: <1.8 ft/sec, indicate of risk for recurrent falls   General Gait Details: Educated on safe AD use with RW. Safely mobilizing without evidence of buckling. Mild instability noted but self-corrects with support from RW. Supervision for safety throughout, cues for symptom awareness.  Stairs Stairs: Yes Stairs assistance: Min assist Stair Management: No rails, Step to pattern, Backwards, With walker Number of Stairs: 2 General stair comments: Simulated set-up for home environment. Educated on various approaches to stair navigation. Husband present and participated. Practiced posterior approach with RW, cues for sequencing min assist for RW block from  husband. Good control. Both feel confident with task and all questions  answered regarding steps.  Wheelchair Mobility    Modified Rankin (Stroke Patients Only)       Balance Overall balance assessment: Mild deficits observed, not formally tested                                           Pertinent Vitals/Pain Pain Assessment Pain Assessment: Faces Faces Pain Scale: Hurts whole lot Pain Location: operative site Pain Descriptors / Indicators: Aching, Operative site guarding, Sore Pain Intervention(s): Premedicated before session, Monitored during session, Limited activity within patient's tolerance, Repositioned    Home Living Family/patient expects to be discharged to:: Private residence Living Arrangements: Spouse/significant other Available Help at Discharge: Family;Available 24 hours/day Type of Home: Mobile home Home Access: Stairs to enter Entrance Stairs-Rails: None Entrance Stairs-Number of Steps: 2   Home Layout: One level Home Equipment: Agricultural consultant (2 wheels);Cane - single point;Shower seat - built in;BSC/3in1;Adaptive equipment;Grab bars - tub/shower;Rollator (4 wheels)      Prior Function Prior Level of Function : Needs assist             Mobility Comments: rollator ADLs Comments: recently receiving assist with LB dressing and stair navigation after L knee surgery May 22 2022     Hand Dominance   Dominant Hand: Right    Extremity/Trunk Assessment   Upper Extremity Assessment Upper Extremity Assessment: Defer to OT evaluation    Lower Extremity Assessment Lower Extremity Assessment: Generalized weakness    Cervical / Trunk Assessment Cervical / Trunk Assessment: Back Surgery  Communication   Communication: No difficulties  Cognition Arousal/Alertness: Awake/alert, Lethargic, Suspect due to medications Behavior During Therapy: WFL for tasks assessed/performed Overall Cognitive Status: Within Functional Limits for tasks  assessed                                          General Comments General comments (skin integrity, edema, etc.): Husband present    Exercises     Assessment/Plan    PT Assessment Patient does not need any further PT services  PT Problem List         PT Treatment Interventions      PT Goals (Current goals can be found in the Care Plan section)  Acute Rehab PT Goals Patient Stated Goal: Get well, have her other surgeries completed this year. PT Goal Formulation: All assessment and education complete, DC therapy    Frequency       Co-evaluation               AM-PAC PT "6 Clicks" Mobility  Outcome Measure Help needed turning from your back to your side while in a flat bed without using bedrails?: A Little Help needed moving from lying on your back to sitting on the side of a flat bed without using bedrails?: A Little Help needed moving to and from a bed to a chair (including a wheelchair)?: A Little Help needed standing up from a chair using your arms (e.g., wheelchair or bedside chair)?: A Little Help needed to walk in hospital room?: A Little Help needed climbing 3-5 steps with a railing? : A Little 6 Click Score: 18    End of Session Equipment Utilized During Treatment: Gait belt;Back brace Activity Tolerance: Patient tolerated treatment well Patient left: in bed;with call bell/phone within  reach;with family/visitor present Nurse Communication: Mobility status PT Visit Diagnosis: Other abnormalities of gait and mobility (R26.89);Muscle weakness (generalized) (M62.81);Difficulty in walking, not elsewhere classified (R26.2);Pain Pain - part of body:  (back)    Time: 6962-9528 PT Time Calculation (min) (ACUTE ONLY): 17 min   Charges:     PT Treatments $Gait Training: 8-22 mins        Candie Mile, PT, DPT Physical Therapist Acute Rehabilitation Services [facility]  Services [facility]   Ellouise Newer 10/31/2022, 10:05 AM

## 2022-10-31 NOTE — TOC Transition Note (Signed)
Transition of Care [facility]) - CM/SW Discharge Note   Patient Details  Name: Felicia Ponce MRN: [id number] Date of Birth: 02-27-1956  [facility] of [facility] CM/SW Contact:  Carles Collet, RN Phone Number: 10/31/2022, 10:20 AM   Clinical Narrative:     Patient with order to DC to home today. Unit staff to provide DME needed for home.   No HH needs identified  Patient will have family/ friends provide transportation home. No other TOC needs identified for DC        Patient Goals and CMS Choice      Discharge Placement                         Discharge Plan and Services Additional resources added to the After Visit Summary for                                       Social Determinants of Health (SDOH) Interventions SDOH Screenings   Food Insecurity: No Food Insecurity (10/31/2022)  Housing: Low Risk  (10/31/2022)  Transportation Needs: No Transportation Needs (10/31/2022)  Utilities: Not At Risk (10/31/2022)  Tobacco Use: Low Risk  (10/30/2022)     Readmission Risk Interventions     No data to display

## 2022-10-31 NOTE — Progress Notes (Signed)
Patient is discharged from room 3C04 at this time. Alert and in stable condition. IV site d/c'd as well as drain. Instructions read to patient and spouse with all questions answered. Left unit via wheelchair with all belongings at side.

## 2022-10-31 NOTE — Discharge Summary (Signed)
Physician Discharge Summary  Patient ID: Felicia Ponce MRN: [id number] DOB/AGE: [birth year] 69 y.o.  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 female underwent an elective L3-4 decompression, extension posterior lumbar interbody fusion.  She tolerated surgery well.  Postoperatively she had a uneventful hospitalization course.  She was ambulating independently, pain was controlled on oral medications.  She was having normal bowel bladder function.  She did have a slight reaction to the IV vancomycin, Benadryl was given.  A secondary postoperative dose was not given.  Her wound was clean dry and intact.  Treatments: Surgery -L3-4 decompression, posterior lumbar interbody fusion, extension of hardware  Discharge Exam: Blood pressure (!) 107/58, pulse 92, temperature 98.8 F (37.1 C), temperature source Oral, resp. rate 18, height 5\' 4"  (1.626 m), weight 96.6 kg, SpO2 93 %. Awake, alert, oriented x 3 Speech fluent, appropriate CN grossly intact 5/5 BUE/BLE Wound c/d/i  Disposition: Discharge disposition: 01-Home or Self Care        Allergies as of 10/31/2022       Reactions   Penicillins Rash, Other (See Comments)   PATIENT HAS HAD A PCN REACTION WITH IMMEDIATE RASH, FACIAL/TONGUE/THROAT SWELLING, SOB, OR LIGHTHEADEDNESS WITH HYPOTENSION:  #  #  #  YES  #  #  #   HAS PT DEVELOPED SEVERE RASH INVOLVING MUCUS MEMBRANES or SKIN NECROSIS: #  #  #  YES  #  #  #   Has patient had a PCN reaction that required hospitalization: No Has patient had a PCN reaction occurring within the last 10 years: No   Ciprofloxacin Nausea Only, Rash, Other (See Comments)   DIZZINESS   Milnacipran Anxiety, Rash, Other  (See Comments)   DIZZINESS, WEAKNESS   Nsaids Rash, Other (See Comments)   ULCERS   Other Other (See Comments)   Chlorine water Acidy Food causes GI upset NO Soy, wheat, gluten, corn products   Savella [milnacipran Hcl] Other (See Comments)   UNSPECIFIED REACTION    Tilactase Other (See Comments)   UNSPECIFIED REACTION    Aspirin Rash   Latex Rash   Pollen Extract Other (See Comments)   Stuffy nose   Sulfa Antibiotics Rash   Sulfamethoxazole-trimethoprim Rash   Sulfasalazine Rash   Tape Rash, Other (See Comments)   Can use paper tape   Tolmetin Rash        Medication List     STOP taking these medications    oxyCODONE-acetaminophen 10-325 MG tablet Commonly known as: PERCOCET       TAKE these medications    albuterol 108 (90 Base) MCG/ACT inhaler Commonly known as: VENTOLIN HFA Inhale 1-2 puffs into the lungs every 6 (six) hours as needed for wheezing or shortness of breath.   baclofen 20 MG tablet Commonly known as: LIORESAL Take 20 mg by mouth 2 (two) times daily.   busPIRone 10 MG tablet Commonly known as: BUSPAR Take 10 mg by mouth 2 (two) times daily.   CALCIUM-MAGNESIUM PO Take 1 tablet by mouth daily. Cetyl Myristoleate   clindamycin 300 MG capsule Commonly known as: CLEOCIN Take 1 capsule (300 mg total) by mouth 3 (three) times daily for 2 days.   dexamethasone 0.75 MG tablet Commonly known as: DECADRON Take 0.75 mg  by mouth every morning.   DULoxetine 60 MG capsule Commonly known as: CYMBALTA Take 60 mg by mouth every morning.   ergocalciferol 1.25 MG (50000 UT) capsule Commonly known as: VITAMIN D2 Take 50,000 Units by mouth every Tuesday.   esomeprazole 20 MG capsule Commonly known as: NEXIUM Take 20 mg by mouth 2 (two) times daily before a meal.   Fish Oil 1000 MG Caps Take 1,000 mg by mouth daily.   folic acid 1 MG tablet Commonly known as: FOLVITE Take 1 mg by mouth daily.   gabapentin 100 MG capsule Commonly known as:  NEURONTIN Take 100 mg by mouth 3 (three) times daily.   hydroxychloroquine 200 MG tablet Commonly known as: PLAQUENIL Take 200 mg by mouth 2 (two) times daily.   hydrOXYzine 10 MG tablet Commonly known as: ATARAX Take 10 mg by mouth at bedtime.   losartan 50 MG tablet Commonly known as: COZAAR Take 50 mg by mouth daily.   Melatonin 5 MG Caps Take 10 mg by mouth at bedtime.   methotrexate 2.5 MG tablet Commonly known as: RHEUMATREX Take 10 mg by mouth once a week. Friday   Oxycodone HCl 10 MG Tabs Take 1 tablet (10 mg total) by mouth every 4 (four) hours as needed for severe pain ((score 7 to 10)).   Ozempic (1 MG/DOSE) 4 MG/3ML Sopn Generic drug: Semaglutide (1 MG/DOSE) Inject 1 mg as directed once a week.   polyethylene glycol 17 g packet Commonly known as: MIRALAX / GLYCOLAX Take 17 g by mouth daily as needed for mild constipation or moderate constipation.   pravastatin 80 MG tablet Commonly known as: PRAVACHOL Take 80 mg by mouth daily.   PRESCRIPTION MEDICATION Take 1 tablet by mouth 2 (two) times daily. E.Q stool softener with laxatives   promethazine 25 MG tablet Commonly known as: PHENERGAN Take 25 mg by mouth every 6 (six) hours as needed for nausea or vomiting.   Restasis 0.05 % ophthalmic emulsion Generic drug: cycloSPORINE Place 1 drop into both eyes daily.   traZODone 100 MG tablet Commonly known as: DESYREL Take 300 mg by mouth at bedtime.   ziprasidone 40 MG capsule Commonly known as: GEODON Take 40 mg by mouth at bedtime.               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   Specialty: Neurosurgery Contact information: [address] [place] [address] [phone]                 Signed: Theodoro Doing Serah Nicoletti 10/31/2022, 9:06 AM

## 2022-10-31 NOTE — Evaluation (Signed)
Occupational Therapy Evaluation Patient Details Name: Felicia Ponce MRN: [id number] DOB: [birth year] Today's Date: 10/31/2022   History of Present Illness Pt is a 67 y.o. female s/p posterior lumbar intermody cusion L3-4. PMH significant for back surgery, ACDF (2019), GERD, fibromyalgia, HTN, MI (2011), pre-diabetes, TIA.   Clinical Impression   PTA, pt lived with husband who has been assisting with stairs and LB dressing since L knee surgery in August. Upon eval, pt performing LB ADL with up to max A and UB ADL with up to mod A for brace application. Pt educated and demonstrating use of compensatory techniques for LB ADL, bed mobility, grooming, toileting, and shower transfers within precautions. Pt with intermittent lethargy due to pain medications and requiring up to min A during functional mobility due to lethargy. Pt and husband both verbalizing understanding of need to rest if lethargy with medication consumption continues as well as staying in contact with MD. Will follow acutely to optimize independence. Pt with good support at home and husband eager to assist.      Recommendations for follow up therapy are one component of a multi-disciplinary discharge planning process, led by the attending physician.  Recommendations may be updated based on patient status, additional functional criteria and insurance authorization.   Follow Up Recommendations  No OT follow up     Assistance Recommended at Discharge Intermittent Supervision/Assistance  Patient can return home with the following A little help with walking and/or transfers;A little help with bathing/dressing/bathroom;Assistance with cooking/housework;Assist for transportation;Help with stairs or ramp for entrance    Functional Status Assessment  Patient has had a recent decline in their functional status and demonstrates the ability to make significant improvements in function in a reasonable and predictable amount of time.   Equipment Recommendations  None recommended by OT    Recommendations for Other Services PT consult     Precautions / Restrictions Precautions Precautions: Back Precaution Booklet Issued: Yes (comment) Precaution Comments: All precautions reviewed within the context of ADL Required Braces or Orthoses: Spinal Brace Spinal Brace: Lumbar corset;Applied in sitting position Restrictions Weight Bearing Restrictions: No      Mobility Bed Mobility Overal bed mobility: Needs Assistance Bed Mobility: Rolling, Sidelying to Sit, Sit to Sidelying Rolling: Min guard Sidelying to sit: Min guard     Sit to sidelying: Mod assist General bed mobility comments: Mod A to bring BLE into bed. Coming to EOB with min guard A and increased time. Heavy use of bed rail. Husband educated regarding how to assist at home.    Transfers Overall transfer level: Needs assistance Equipment used: Rolling walker (2 wheels) Transfers: Sit to/from Stand Sit to Stand: Min guard           General transfer comment: min guard A for safety      Balance Overall balance assessment: Mild deficits observed, not formally tested                                         ADL either performed or assessed with clinical judgement   ADL Overall ADL's : Needs assistance/impaired Eating/Feeding: Modified independent;Sitting   Grooming: Min guard;Standing   Upper Body Bathing: Set up;Sitting   Lower Body Bathing: Moderate assistance;Sit to/from stand   Upper Body Dressing : Moderate assistance;Sitting;With caregiver independent assisting Upper Body Dressing Details (indicate cue type and reason): mod A for brace application; husband Brett Canales indep assisting  Lower Body Dressing: Maximal assistance;With caregiver independent assisting;Sit to/from stand Lower Body Dressing Details (indicate cue type and reason): Husband performing LB dressing Toilet Transfer: Min guard;Ambulation;BSC/3in1;Rolling  walker (2 wheels)     Toileting - Clothing Manipulation Details (indicate cue type and reason): reviewed compensatory techniques Tub/ Shower Transfer: Walk-in shower;Min guard;Minimal assistance;Ambulation;Rolling walker (2 wheels) Tub/Shower Transfer Details (indicate cue type and reason): Min A due to onset of lethargy after transfer. Functional mobility during ADLs: Min guard;Minimal assistance;Rolling walker (2 wheels) General ADL Comments: Up to min A during functional mobility of longer distance due to intermittend lethargy due to pain medication     Vision Ability to See in Adequate Light: 0 Adequate Patient Visual Report: No change from baseline Vision Assessment?: No apparent visual deficits     Perception     Praxis      Pertinent Vitals/Pain Pain Assessment Pain Assessment: Faces Faces Pain Scale: Hurts whole lot Pain Location: operative site Pain Descriptors / Indicators: Aching, Operative site guarding, Sore Pain Intervention(s): Monitored during session, Limited activity within patient's tolerance, Premedicated before session     Hand Dominance Right   Extremity/Trunk Assessment Upper Extremity Assessment Upper Extremity Assessment: Generalized weakness   Lower Extremity Assessment Lower Extremity Assessment: Defer to PT evaluation   Cervical / Trunk Assessment Cervical / Trunk Assessment: Back Surgery   Communication Communication Communication: No difficulties   Cognition Arousal/Alertness: Awake/alert, Lethargic, Suspect due to medications Behavior During Therapy: WFL for tasks assessed/performed Overall Cognitive Status: Within Functional Limits for tasks assessed                                 General Comments: Reporting she "still feels loopy" from pain medication and instances of lethargy, but following all commands and maintaing precautions during ADL     General Comments  Husband present    Exercises     Shoulder  Instructions      Home Living Family/patient expects to be discharged to:: Private residence Living Arrangements: Spouse/significant other Available Help at Discharge: Family;Available 24 hours/day Type of Home: Mobile home Home Access: Stairs to enter Entrance Stairs-Number of Steps: 2 Entrance Stairs-Rails: None Home Layout: One level     Bathroom Shower/Tub: Occupational psychologist: Standard     Home Equipment: Conservation officer, nature (2 wheels);Cane - single point;Shower seat - built in;BSC/3in1;Adaptive equipment;Grab bars - tub/shower Adaptive Equipment: Reacher;Other (Comment) (Home made shoe horn?)        Prior Functioning/Environment Prior Level of Function : Needs assist             Mobility Comments: rollator ADLs Comments: recently receiving assist with LB dressing and stair navigation after L knee surgery May 22 2022        OT Problem List: Decreased strength;Decreased activity tolerance;Impaired balance (sitting and/or standing);Decreased safety awareness;Decreased knowledge of use of DME or AE;Decreased knowledge of precautions;Pain      OT Treatment/Interventions: Self-care/ADL training;Therapeutic exercise;DME and/or AE instruction;Therapeutic activities;Patient/family education    OT Goals(Current goals can be found in the care plan section) Acute Rehab OT Goals Patient Stated Goal: decr pain OT Goal Formulation: With patient Time For Goal Achievement: 11/14/22 Potential to Achieve Goals: Good  OT Frequency: Min 2X/week    Co-evaluation              AM-PAC OT "6 Clicks" Daily Activity     Outcome Measure Help from another person eating meals?: None  Help from another person taking care of personal grooming?: A Little Help from another person toileting, which includes using toliet, bedpan, or urinal?: A Little Help from another person bathing (including washing, rinsing, drying)?: A Little Help from another person to put on and taking  off regular upper body clothing?: A Lot Help from another person to put on and taking off regular lower body clothing?: A Lot 6 Click Score: 17   End of Session Equipment Utilized During Treatment: Gait belt;Rolling walker (2 wheels);Back brace Nurse Communication: Mobility status  Activity Tolerance: Patient limited by lethargy (secondary to medication) Patient left: in bed;with call bell/phone within reach;with family/visitor present  OT Visit Diagnosis: Unsteadiness on feet (R26.81);Muscle weakness (generalized) (M62.81);Pain Pain - part of body:  (back)                Time: 4132-4401 OT Time Calculation (min): 27 min Charges:  OT General Charges $OT Visit: 1 Visit OT Evaluation $OT Eval Low Complexity: 1 Low OT Treatments $Self Care/Home Management : 8-22 mins  Elder Cyphers, OTR/L [facility] Acute Rehabilitation Office: [phone]   Magnus Ivan 10/31/2022, 9:38 AM

## 2022-11-13 MED FILL — Sodium Chloride IV Soln 0.9%: INTRAVENOUS | Qty: 1000 | Status: AC

## 2022-11-13 MED FILL — Heparin Sodium (Porcine) Inj 1000 Unit/ML: INTRAMUSCULAR | Qty: 10 | Status: AC

## 2024-01-27 ENCOUNTER — Other Ambulatory Visit: Payer: Self-pay | Admitting: Neurosurgery

## 2024-02-01 NOTE — Progress Notes (Signed)
 Surgical Instructions   Your procedure is scheduled on February 04, 2024. Report to [facility] Main Entrance "A" at 8:30 A.M., then check in with the Admitting office. Any questions or running late day of surgery: call [phone]  Questions prior to your surgery date: call [phone], Monday-Friday, 8am-4pm. If you experience any cold or flu symptoms such as cough, fever, chills, shortness of breath, etc. between now and your scheduled surgery, please notify us  at the above number.     Remember:  Do not eat or drink after midnight the night before your surgery      Take these medicines the morning of surgery with A SIP OF WATER   baclofen  (LIORESAL )  dexamethasone  (DECADRON )  DULoxetine  (CYMBALTA )  hydroxychloroquine  (PLAQUENIL )  pravastatin  (PRAVACHOL )    May take these medicines IF NEEDED: oxyCODONE  (ROXICODONE )  promethazine  (PHENERGAN )   OZEMPIC January 26, 2024   One week prior to surgery, STOP taking any Aspirin (unless otherwise instructed by your surgeon) Aleve, Naproxen, Ibuprofen, Motrin, Advil, Goody's, BC's, all herbal medications, fish oil, and non-prescription vitamins.                     Do NOT Smoke (Tobacco/Vaping) for 24 hours prior to your procedure.  If you use a CPAP at night, you may bring your mask/headgear for your overnight stay.   You will be asked to remove any contacts, glasses, piercing's, hearing aid's, dentures/partials prior to surgery. Please bring cases for these items if needed.    Patients discharged the day of surgery will not be allowed to drive home, and someone needs to stay with them for 24 hours.  SURGICAL WAITING ROOM VISITATION Patients may have no more than 2 support people in the waiting area - these visitors may rotate.   Pre-op nurse will coordinate an appropriate time for 1 ADULT support person, who may not rotate, to accompany patient in pre-op.  Children under the age of 69 must have an adult with them who is not the  patient and must remain in the main waiting area with an adult.  If the patient needs to stay at the hospital during part of their recovery, the visitor guidelines for inpatient rooms apply.  Please refer to the [facility] website for the visitor guidelines for any additional information.   If you received a COVID test during your pre-op visit  it is requested that you wear a mask when out in public, stay away from anyone that may not be feeling well and notify your surgeon if you develop symptoms. If you have been in contact with anyone that has tested positive in the last 10 days please notify you surgeon.      Pre-operative 5 CHG Bathing Instructions   You can play a key role in reducing the risk of infection after surgery. Your skin needs to be as free of germs as possible. You can reduce the number of germs on your skin by washing with CHG (chlorhexidine  gluconate) soap before surgery. CHG is an antiseptic soap that kills germs and continues to kill germs even after washing.   DO NOT use if you have an allergy to chlorhexidine /CHG or antibacterial soaps. If your skin becomes reddened or irritated, stop using the CHG and notify one of our RNs at [phone].   Please shower with the CHG soap starting 4 days before surgery using the following schedule:     Please keep in mind the following:  DO NOT shave, including legs and  underarms, starting the day of your first shower.   You may shave your face at any point before/day of surgery.  Place clean sheets on your bed the day you start using CHG soap. Use a clean washcloth (not used since being washed) for each shower. DO NOT sleep with pets once you start using the CHG.   CHG Shower Instructions:  Wash your face and private area with normal soap. If you choose to wash your hair, wash first with your normal shampoo.  After you use shampoo/soap, rinse your hair and body thoroughly to remove shampoo/soap residue.  Turn the water  OFF  and apply about 3 tablespoons (45 ml) of CHG soap to a CLEAN washcloth.  Apply CHG soap ONLY FROM YOUR NECK DOWN TO YOUR TOES (washing for 3-5 minutes)  DO NOT use CHG soap on face, private areas, open wounds, or sores.  Pay special attention to the area where your surgery is being performed.  If you are having back surgery, having someone wash your back for you may be helpful. Wait 2 minutes after CHG soap is applied, then you may rinse off the CHG soap.  Pat dry with a clean towel  Put on clean clothes/pajamas   If you choose to wear lotion, please use ONLY the CHG-compatible lotions that are listed below.  Additional instructions for the day of surgery: DO NOT APPLY any lotions, deodorants, cologne, or perfumes.   Do not bring valuables to the hospital. [facility] is not responsible for any belongings/valuables. Do not wear nail polish, gel polish, artificial nails, or any other type of covering on natural nails (fingers and toes) Do not wear jewelry or makeup Put on clean/comfortable clothes.  Please brush your teeth.  Ask your nurse before applying any prescription medications to the skin.     CHG Compatible Lotions   Aveeno Moisturizing lotion  Cetaphil Moisturizing Cream  Cetaphil Moisturizing Lotion  Clairol Herbal Essence Moisturizing Lotion, Dry Skin  Clairol Herbal Essence Moisturizing Lotion, Extra Dry Skin  Clairol Herbal Essence Moisturizing Lotion, Normal Skin  Curel Age Defying Therapeutic Moisturizing Lotion with Alpha Hydroxy  Curel Extreme Care Body Lotion  Curel Soothing Hands Moisturizing Hand Lotion  Curel Therapeutic Moisturizing Cream, Fragrance-Free  Curel Therapeutic Moisturizing Lotion, Fragrance-Free  Curel Therapeutic Moisturizing Lotion, Original Formula  Eucerin Daily Replenishing Lotion  Eucerin Dry Skin Therapy Plus Alpha Hydroxy Crme  Eucerin Dry Skin Therapy Plus Alpha Hydroxy Lotion  Eucerin Original Crme  Eucerin Original Lotion   Eucerin Plus Crme Eucerin Plus Lotion  Eucerin TriLipid Replenishing Lotion  Keri Anti-Bacterial Hand Lotion  Keri Deep Conditioning Original Lotion Dry Skin Formula Softly Scented  Keri Deep Conditioning Original Lotion, Fragrance Free Sensitive Skin Formula  Keri Lotion Fast Absorbing Fragrance Free Sensitive Skin Formula  Keri Lotion Fast Absorbing Softly Scented Dry Skin Formula  Keri Original Lotion  Keri Skin Renewal Lotion Keri Silky Smooth Lotion  Keri Silky Smooth Sensitive Skin Lotion  Nivea Body Creamy Conditioning Oil  Nivea Body Extra Enriched Lotion  Nivea Body Original Lotion  Nivea Body Sheer Moisturizing Lotion Nivea Crme  Nivea Skin Firming Lotion  NutraDerm 30 Skin Lotion  NutraDerm Skin Lotion  NutraDerm Therapeutic Skin Cream  NutraDerm Therapeutic Skin Lotion  ProShield Protective Hand Cream  Provon moisturizing lotion  Please read over the following fact sheets that you were given.

## 2024-02-02 ENCOUNTER — Other Ambulatory Visit: Payer: Self-pay

## 2024-02-02 ENCOUNTER — Encounter (HOSPITAL_COMMUNITY): Payer: Self-pay

## 2024-02-02 ENCOUNTER — Encounter (HOSPITAL_COMMUNITY)
Admission: RE | Admit: 2024-02-02 | Discharge: 2024-02-02 | Disposition: A | Discharge status: Home / Self Care | Source: Ambulatory Visit | Attending: Neurosurgery | Admitting: Neurosurgery

## 2024-02-02 VITALS — BP 125/62 | HR 78 | Temp 98.1°F | Resp 17 | Ht 65.0 in | Wt 195.0 lb

## 2024-02-02 DIAGNOSIS — Z01818 Encounter for other preprocedural examination: Secondary | ICD-10-CM

## 2024-02-02 DIAGNOSIS — Z8673 Personal history of transient ischemic attack (TIA), and cerebral infarction without residual deficits: Secondary | ICD-10-CM | POA: Diagnosis not present

## 2024-02-02 DIAGNOSIS — Z86718 Personal history of other venous thrombosis and embolism: Secondary | ICD-10-CM | POA: Insufficient documentation

## 2024-02-02 DIAGNOSIS — R600 Localized edema: Secondary | ICD-10-CM | POA: Diagnosis not present

## 2024-02-02 DIAGNOSIS — Z01812 Encounter for preprocedural laboratory examination: Secondary | ICD-10-CM | POA: Diagnosis not present

## 2024-02-02 DIAGNOSIS — I2510 Atherosclerotic heart disease of native coronary artery without angina pectoris: Secondary | ICD-10-CM | POA: Insufficient documentation

## 2024-02-02 DIAGNOSIS — E785 Hyperlipidemia, unspecified: Secondary | ICD-10-CM | POA: Diagnosis not present

## 2024-02-02 DIAGNOSIS — I10 Essential (primary) hypertension: Secondary | ICD-10-CM | POA: Insufficient documentation

## 2024-02-02 DIAGNOSIS — Z981 Arthrodesis status: Secondary | ICD-10-CM | POA: Insufficient documentation

## 2024-02-02 HISTORY — DX: Anxiety disorder, unspecified: F41.9

## 2024-02-02 HISTORY — DX: Pain in unspecified joint: M25.50

## 2024-02-02 HISTORY — DX: Other complications of anesthesia, initial encounter: T88.59XA

## 2024-02-02 LAB — CBC
HCT: 46.9 % — ABNORMAL HIGH (ref 36.0–46.0)
Hemoglobin: 14.9 g/dL (ref 12.0–15.0)
MCH: 29 pg (ref 26.0–34.0)
MCHC: 31.8 g/dL (ref 30.0–36.0)
MCV: 91.4 fL (ref 80.0–100.0)
Platelets: 295 10*3/uL (ref 150–400)
RBC: 5.13 MIL/uL — ABNORMAL HIGH (ref 3.87–5.11)
RDW: 13.7 % (ref 11.5–15.5)
WBC: 9.8 10*3/uL (ref 4.0–10.5)
nRBC: 0 % (ref 0.0–0.2)

## 2024-02-02 LAB — BASIC METABOLIC PANEL WITH GFR
Anion gap: 10 (ref 5–15)
BUN: 11 mg/dL (ref 8–23)
CO2: 23 mmol/L (ref 22–32)
Calcium: 8.9 mg/dL (ref 8.9–10.3)
Chloride: 106 mmol/L (ref 98–111)
Creatinine, Ser: 0.94 mg/dL (ref 0.44–1.00)
GFR, Estimated: >60 mL/min (ref >60)
Glucose, Bld: 98 mg/dL (ref 70–99)
Potassium: 4.1 mmol/L (ref 3.5–5.1)
Sodium: 139 mmol/L (ref 135–145)

## 2024-02-02 LAB — SURGICAL PCR SCREEN
MRSA, PCR: NEGATIVE
Staphylococcus aureus: NEGATIVE

## 2024-02-02 NOTE — Progress Notes (Addendum)
 PCP - Dr. Lon Risser Cardiologist - Dr. Rainey Burden  PPM/ICD - denies   Chest x-ray - 05/26/17 EKG - 01/31/24- records requested Stress Test - 10/2021 ECHO - 10/2020 Cardiac Cath - 01/23/13- CE  Sleep Study - OSA+ CPAP - denies  DM- denies  Last dose of GLP1 agonist-  01/26/24 Pt knows not to take any further doses prior to surgery   ASA/Blood Thinner Instructions: n/a   ERAS Protcol - no, NPO   COVID TEST- n/a   Anesthesia review: yes, cardiac hx. Records requested. Pt also has special instructions from Endocrinologist in paper chart. Instructions are from 2023 but she said these are the same instructions for any surgery she has.   Patient denies shortness of breath, fever, cough and chest pain at PAT appointment   All instructions explained to the patient, with a verbal understanding of the material. Patient agrees to go over the instructions while at home for a better understanding. The opportunity to ask questions was provided.

## 2024-02-02 NOTE — Progress Notes (Signed)
 Surgical Instructions   Your procedure is scheduled on February 04, 2024. Report to [facility] Main Entrance "A" at 8:30 A.M., then check in with the Admitting office. Any questions or running late day of surgery: call [phone]  Questions prior to your surgery date: call [phone], Monday-Friday, 8am-4pm. If you experience any cold or flu symptoms such as cough, fever, chills, shortness of breath, etc. between now and your scheduled surgery, please notify us  at the above number.     Remember:  Do not eat or drink after midnight the night before your surgery      Take these medicines the morning of surgery with A SIP OF WATER   baclofen  (LIORESAL )  dexamethasone  (DECADRON )  DULoxetine  (CYMBALTA )  pravastatin  (PRAVACHOL )    May take these medicines IF NEEDED: oxyCODONE  (ROXICODONE )  promethazine  (PHENERGAN )    Follow prescriber's instructions regarding hydroxychloroquine  (PLAQUENIL ) and methotrexate  (RHEUMATREX).  STOP taking OZEMPIC 7 days prior to surgery. Last dose on or before  January 25, 2024   One week prior to surgery, STOP taking any Aspirin (unless otherwise instructed by your surgeon) Aleve, Naproxen, Ibuprofen, Motrin, Advil, Goody's, BC's, all herbal medications, fish oil, and non-prescription vitamins.                     Do NOT Smoke (Tobacco/Vaping) for 24 hours prior to your procedure.  If you use a CPAP at night, you may bring your mask/headgear for your overnight stay.   You will be asked to remove any contacts, glasses, piercing's, hearing aid's, dentures/partials prior to surgery. Please bring cases for these items if needed.    Patients discharged the day of surgery will not be allowed to drive home, and someone needs to stay with them for 24 hours.  SURGICAL WAITING ROOM VISITATION Patients may have no more than 2 support people in the waiting area - these visitors may rotate.   Pre-op nurse will coordinate an appropriate time for 1 ADULT support  person, who may not rotate, to accompany patient in pre-op.  Children under the age of 18 must have an adult with them who is not the patient and must remain in the main waiting area with an adult.  If the patient needs to stay at the hospital during part of their recovery, the visitor guidelines for inpatient rooms apply.  Please refer to the [facility] website for the visitor guidelines for any additional information.   If you received a COVID test during your pre-op visit  it is requested that you wear a mask when out in public, stay away from anyone that may not be feeling well and notify your surgeon if you develop symptoms. If you have been in contact with anyone that has tested positive in the last 10 days please notify you surgeon.      Pre-operative 5 CHG Bathing Instructions   You can play a key role in reducing the risk of infection after surgery. Your skin needs to be as free of germs as possible. You can reduce the number of germs on your skin by washing with CHG (chlorhexidine  gluconate) soap before surgery. CHG is an antiseptic soap that kills germs and continues to kill germs even after washing.   DO NOT use if you have an allergy to chlorhexidine /CHG or antibacterial soaps. If your skin becomes reddened or irritated, stop using the CHG and notify one of our RNs at [phone].   Please shower with the CHG soap starting 4 days before surgery  using the following schedule:     Please keep in mind the following:  DO NOT shave, including legs and underarms, starting the day of your first shower.   You may shave your face at any point before/day of surgery.  Place clean sheets on your bed the day you start using CHG soap. Use a clean washcloth (not used since being washed) for each shower. DO NOT sleep with pets once you start using the CHG.   CHG Shower Instructions:  Wash your face and private area with normal soap. If you choose to wash your hair, wash first with your  normal shampoo.  After you use shampoo/soap, rinse your hair and body thoroughly to remove shampoo/soap residue.  Turn the water  OFF and apply about 3 tablespoons (45 ml) of CHG soap to a CLEAN washcloth.  Apply CHG soap ONLY FROM YOUR NECK DOWN TO YOUR TOES (washing for 3-5 minutes)  DO NOT use CHG soap on face, private areas, open wounds, or sores.  Pay special attention to the area where your surgery is being performed.  If you are having back surgery, having someone wash your back for you may be helpful. Wait 2 minutes after CHG soap is applied, then you may rinse off the CHG soap.  Pat dry with a clean towel  Put on clean clothes/pajamas   If you choose to wear lotion, please use ONLY the CHG-compatible lotions that are listed below.  Additional instructions for the day of surgery: DO NOT APPLY any lotions, deodorants, cologne, or perfumes.   Do not bring valuables to the hospital. [facility] is not responsible for any belongings/valuables. Do not wear nail polish, gel polish, artificial nails, or any other type of covering on natural nails (fingers and toes) Do not wear jewelry or makeup Put on clean/comfortable clothes.  Please brush your teeth.  Ask your nurse before applying any prescription medications to the skin.     CHG Compatible Lotions   Aveeno Moisturizing lotion  Cetaphil Moisturizing Cream  Cetaphil Moisturizing Lotion  Clairol Herbal Essence Moisturizing Lotion, Dry Skin  Clairol Herbal Essence Moisturizing Lotion, Extra Dry Skin  Clairol Herbal Essence Moisturizing Lotion, Normal Skin  Curel Age Defying Therapeutic Moisturizing Lotion with Alpha Hydroxy  Curel Extreme Care Body Lotion  Curel Soothing Hands Moisturizing Hand Lotion  Curel Therapeutic Moisturizing Cream, Fragrance-Free  Curel Therapeutic Moisturizing Lotion, Fragrance-Free  Curel Therapeutic Moisturizing Lotion, Original Formula  Eucerin Daily Replenishing Lotion  Eucerin Dry Skin Therapy  Plus Alpha Hydroxy Crme  Eucerin Dry Skin Therapy Plus Alpha Hydroxy Lotion  Eucerin Original Crme  Eucerin Original Lotion  Eucerin Plus Crme Eucerin Plus Lotion  Eucerin TriLipid Replenishing Lotion  Keri Anti-Bacterial Hand Lotion  Keri Deep Conditioning Original Lotion Dry Skin Formula Softly Scented  Keri Deep Conditioning Original Lotion, Fragrance Free Sensitive Skin Formula  Keri Lotion Fast Absorbing Fragrance Free Sensitive Skin Formula  Keri Lotion Fast Absorbing Softly Scented Dry Skin Formula  Keri Original Lotion  Keri Skin Renewal Lotion Keri Silky Smooth Lotion  Keri Silky Smooth Sensitive Skin Lotion  Nivea Body Creamy Conditioning Oil  Nivea Body Extra Enriched Lotion  Nivea Body Original Lotion  Nivea Body Sheer Moisturizing Lotion Nivea Crme  Nivea Skin Firming Lotion  NutraDerm 30 Skin Lotion  NutraDerm Skin Lotion  NutraDerm Therapeutic Skin Cream  NutraDerm Therapeutic Skin Lotion  ProShield Protective Hand Cream  Provon moisturizing lotion  Please read over the following fact sheets that you were given.

## 2024-02-03 NOTE — Anesthesia Preprocedure Evaluation (Signed)
 Anesthesia Evaluation  Patient identified by MRN, date of birth, ID band Patient awake    Reviewed: Allergy & Precautions, NPO status , Patient's Chart, lab work & pertinent test results  Airway Mallampati: III  TM Distance: >3 FB Neck ROM: Full    Dental no notable dental hx.    Pulmonary sleep apnea    Pulmonary exam normal        Cardiovascular hypertension, Pt. 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  Reproductive/Obstetrics                             Anesthesia Physical Anesthesia Plan  ASA: 3  Anesthesia Plan: General   Post-op Pain Management:    Induction: Intravenous  PONV Risk Score and Plan: 3 and Ondansetron , Dexamethasone , Treatment may vary due to age or medical condition and Midazolam   Airway Management Planned: Oral ETT  Additional Equipment:   Intra-op Plan:   Post-operative Plan: Extubation in OR  Informed Consent: I have reviewed the patients History and Physical, chart, labs and discussed the procedure including the risks, benefits and alternatives for the proposed anesthesia with the patient or authorized representative who has indicated his/her understanding and acceptance.     Dental advisory given  Plan Discussed with: CRNA  Anesthesia Plan Comments: (PAT note by Rudy Costain, PA-C:  Pt follows with cardiologist Dr. Daun Epstein for history of HTN, HLD, remote history of DVT at  age 68, TIA, LE edema, reported MI (patient reports "mild" MI in 2014, however there is no corroboration of this in her medical records that I can find), nonobstructive CAD.  Cardiac cath 01/13/2013 showed widely patent coronaries.  I cannot directly review Dr.Vybiral's notes, however, when patient was seen by her PCP Lawernce Presto, PA-C on 05/11/2022 her recent cardiac testing was discussed.  Per note, she had nuclear stress test 10/2021 that showed normal perfusion and echocardiogram 10/2020 that showed LVEF 55%, mild concentric LVH, mild MR, mild TR, mild PR.    Pt reports she was last seen by Dr. Daun Epstein 01/31/24 and was cleared for surgery at that time. OV note and EKG have been requested. Pt also stated clearance was faxed to [facility]. Bettyjo Brunette, RN left a VM with surgery scheduler Sherian Dimitri requesting it be faxed to us .  History of adrenal insufficiency followed by endocrinologist Dr. Brooks Cao.  She is maintained on Decadron  0.75 mg daily she has standing instructions regarding perioperative stress dosing.  Per notes, "The day before surgery she should take her usual steroids. -In holding area DOS, she should be given 50mg  hydrocortisone  IV, then 25mg  q 8 hrs until she is taking oral medications. -Once she is taking oral meds, she can resume her usual replacement steroids. -If she remains acutely ill (ie persistent nausea or fever) then continue 25mg  hydrocortisone  IV twice daily until her acute illness resolves, then resume her baseline replacement."  Other pertinent hx includes rheumatoid arthritis maintained on Plaquenil  and  methotrexate , hyperthyroid (previously on methimazole  but per prior PCP notes this was discontinued and TFTs normalized).  History of OSA, noncompliant with CPAP.  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  She is reportedly prediabetic related to long-term steroid use.  She is on once weekly GLP-1 agonist Ozempic and reports LD 01/26/24.  Underwent L3-4 fusion 10/30/2022 without  complication.  Preop labs reviewed, unremarkable.   EKG 10/30/22: Normal sinus rhythm. Rate 86. Cannot rule out Inferior infarct , age undetermined. No significant change.   PFT 04/05/2017 (care everywhere): Component Name Value Ref Range FEV1 2.34 Comment: 107% liters FVC 3.09 Comment: 103% liters FEV1/FVC 76 Comment: 76% % TLC 4.78 Comment: 98% liters DLCO 18.3 Comment: 78% ml/mmHg sec PEAK FLOW  Comment: 96% 20 - 800 L/MIN Result Impression Normal spirometry lung volumes and diffusing capacity   Cath 01/16/2013 (care everywhere): CONCLUSIONS:  CORONARY STATUS: Mild non-obstructive ASCAD  LV FUNCTION: Ejection Fraction:60% Wall Motion:Normal  OTHER:Widely patent coronaries. Normal left main. Normal left ventricular function. Rt brachiocephalic artery 75% prox.Gradients peak, mean 20, 6 mmHg  )        Anesthesia Quick Evaluation

## 2024-02-03 NOTE — Progress Notes (Signed)
 Anesthesia Chart Review:  Pt follows with cardiologist Dr. Vybiral for history of HTN, HLD, remote history of DVT at age 68, TIA, LE edema, reported MI (patient reports "mild" MI in 2014, however there is no corroboration of this in her medical records that I can find), nonobstructive CAD.  Cardiac cath 01/13/2013 showed widely patent coronaries.  I cannot directly review Dr.Vybiral's notes, however, when patient was seen by her PCP Lawernce Presto, PA-C on 05/11/2022 her recent cardiac testing was discussed.  Per note, she had nuclear stress test 10/2021 that showed normal perfusion and echocardiogram 10/2020 that showed LVEF 55%, mild concentric LVH, mild MR, mild TR, mild PR.    Pt reports she was last seen by Dr. Daun Epstein 01/31/24 and was cleared for surgery at that time. OV note and EKG have been requested. Pt also stated clearance was faxed to [facility]. Bettyjo Brunette, RN left a VM with surgery scheduler Sherian Dimitri requesting it be faxed to us .  History of adrenal insufficiency followed by endocrinologist Dr. Brooks Cao.  She is maintained on Decadron  0.75 mg daily she has standing instructions regarding perioperative stress dosing.  Per notes, "The day before surgery she should take her usual steroids. -In holding area DOS, she should be given 50mg  hydrocortisone  IV, then 25mg  q 8 hrs until she is taking oral medications. -Once she is taking oral meds, she can resume her usual replacement steroids. -If she remains acutely ill (ie persistent nausea or fever) then continue 25mg  hydrocortisone  IV twice daily until her acute illness resolves, then resume her baseline replacement."   Other pertinent hx includes rheumatoid arthritis maintained on Plaquenil  and methotrexate , hyperthyroid (previously on methimazole  but per prior PCP notes this was discontinued and TFTs normalized).   History of OSA, noncompliant with CPAP.   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   She is reportedly prediabetic related to long-term steroid  use.  She is on once weekly GLP-1 agonist Ozempic and reports LD 01/26/24.   Underwent L3-4 fusion 10/30/2022 without complication.  Preop labs reviewed, unremarkable.    EKG 10/30/22: Normal sinus rhythm. Rate 86. Cannot rule out Inferior infarct , age undetermined. No significant change.    PFT 04/05/2017 (care everywhere): Component Name Value Ref Range  FEV1 2.34  Comment: 107% liters  FVC 3.09  Comment: 103% liters  FEV1/FVC 76  Comment: 76% %  TLC 4.78  Comment: 98% liters  DLCO 18.3  Comment: 78% ml/mmHg sec  PEAK FLOW    Comment: 96% 20 - 800 L/MIN  Result Impression  Normal spirometry lung volumes and diffusing capacity      Cath 01/16/2013 (care everywhere): CONCLUSIONS:       CORONARY STATUS: Mild non-obstructive ASCAD         LV FUNCTION:              Ejection Fraction:   60%              Wall Motion:         Normal            OTHER:  Widely patent coronaries. Normal left main. Normal left ventricular function. Rt brachiocephalic artery 75% prox.  Gradients peak, mean 20, 6 mmHg    Edilia Gordon [facility]/Anesthesiology Phone [phone] 02/03/2024 3:51 PM

## 2024-02-04 ENCOUNTER — Other Ambulatory Visit: Payer: Self-pay

## 2024-02-04 ENCOUNTER — Encounter (HOSPITAL_COMMUNITY): Payer: Self-pay | Admitting: Neurosurgery

## 2024-02-04 ENCOUNTER — Observation Stay (HOSPITAL_COMMUNITY)
Admission: RE | Admit: 2024-02-04 | Discharge: 2024-02-04 | Disposition: A | Discharge status: Home / Self Care | Attending: Neurosurgery | Admitting: Neurosurgery

## 2024-02-04 ENCOUNTER — Ambulatory Visit (HOSPITAL_COMMUNITY)

## 2024-02-04 ENCOUNTER — Ambulatory Visit (HOSPITAL_BASED_OUTPATIENT_CLINIC_OR_DEPARTMENT_OTHER)

## 2024-02-04 ENCOUNTER — Ambulatory Visit (HOSPITAL_COMMUNITY): Admitting: Physician Assistant

## 2024-02-04 ENCOUNTER — Encounter (HOSPITAL_COMMUNITY)
Admission: RE | Disposition: A | Discharge status: Home / Self Care | Payer: Self-pay | Source: Home / Self Care | Attending: Neurosurgery

## 2024-02-04 DIAGNOSIS — Z96642 Presence of left artificial hip joint: Secondary | ICD-10-CM | POA: Insufficient documentation

## 2024-02-04 DIAGNOSIS — M4807 Spinal stenosis, lumbosacral region: Secondary | ICD-10-CM | POA: Diagnosis not present

## 2024-02-04 DIAGNOSIS — Z8673 Personal history of transient ischemic attack (TIA), and cerebral infarction without residual deficits: Secondary | ICD-10-CM | POA: Insufficient documentation

## 2024-02-04 DIAGNOSIS — M5416 Radiculopathy, lumbar region: Principal | ICD-10-CM | POA: Diagnosis present

## 2024-02-04 DIAGNOSIS — M48061 Spinal stenosis, lumbar region without neurogenic claudication: Secondary | ICD-10-CM | POA: Diagnosis not present

## 2024-02-04 DIAGNOSIS — Z91040 Latex allergy status: Secondary | ICD-10-CM | POA: Insufficient documentation

## 2024-02-04 DIAGNOSIS — Z79899 Other long term (current) drug therapy: Secondary | ICD-10-CM | POA: Insufficient documentation

## 2024-02-04 DIAGNOSIS — M4727 Other spondylosis with radiculopathy, lumbosacral region: Secondary | ICD-10-CM | POA: Diagnosis present

## 2024-02-04 DIAGNOSIS — I10 Essential (primary) hypertension: Secondary | ICD-10-CM | POA: Diagnosis not present

## 2024-02-04 HISTORY — PX: LUMBAR LAMINECTOMY/DECOMPRESSION MICRODISCECTOMY: SHX5026

## 2024-02-04 SURGERY — LUMBAR LAMINECTOMY/DECOMPRESSION MICRODISCECTOMY 1 LEVEL
Anesthesia: General | Site: Back | Laterality: Right

## 2024-02-04 MED ORDER — ACETAMINOPHEN 650 MG RE SUPP: 650.0000 mg | RECTAL | Status: DC | PRN

## 2024-02-04 MED ORDER — PROPOFOL 10 MG/ML IV BOLUS
INTRAVENOUS | Status: DC | PRN
  Administered 2024-02-04: 150 mg via INTRAVENOUS

## 2024-02-04 MED ORDER — CEFAZOLIN SODIUM-DEXTROSE 2-4 GM/100ML-% IV SOLN
2.0000 g | INTRAVENOUS | Status: AC
  Administered 2024-02-04: 2 g via INTRAVENOUS

## 2024-02-04 MED ORDER — OXYCODONE HCL 5 MG PO TABS
15.0000 mg | ORAL_TABLET | ORAL | Status: DC | PRN
  Administered 2024-02-04: 15 mg via ORAL
  Filled 2024-02-04: qty 3

## 2024-02-04 MED ORDER — DEXAMETHASONE 4 MG PO TABS: 4.0000 mg | ORAL_TABLET | Freq: Four times a day (QID) | ORAL | Status: DC

## 2024-02-04 MED ORDER — CEFAZOLIN SODIUM-DEXTROSE 2-4 GM/100ML-% IV SOLN
INTRAVENOUS | Status: AC
Start: 2024-02-04 — End: 2024-02-04
  Filled 2024-02-04: qty 100

## 2024-02-04 MED ORDER — FOLIC ACID 1 MG PO TABS: 1.0000 mg | ORAL_TABLET | Freq: Every day | ORAL | Status: DC

## 2024-02-04 MED ORDER — SENNOSIDES-DOCUSATE SODIUM 8.6-50 MG PO TABS
1.0000 | ORAL_TABLET | Freq: Every day | ORAL | Status: DC
Start: 2024-02-04 — End: 2024-02-04
  Administered 2024-02-04: 1 via ORAL
  Filled 2024-02-04: qty 1

## 2024-02-04 MED ORDER — DEXAMETHASONE SODIUM PHOSPHATE 4 MG/ML IJ SOLN
4.0000 mg | Freq: Four times a day (QID) | INTRAMUSCULAR | Status: DC
  Administered 2024-02-04: 4 mg via INTRAVENOUS
  Filled 2024-02-04: qty 1

## 2024-02-04 MED ORDER — POLYETHYLENE GLYCOL 3350 17 G PO PACK: 17.0000 g | PACK | Freq: Every day | ORAL | Status: DC | PRN

## 2024-02-04 MED ORDER — CEFAZOLIN SODIUM-DEXTROSE 1-4 GM/50ML-% IV SOLN: 1.0000 g | Freq: Three times a day (TID) | INTRAVENOUS | Status: DC

## 2024-02-04 MED ORDER — BUPIVACAINE HCL (PF) 0.25 % IJ SOLN
INTRAMUSCULAR | Status: AC
  Filled 2024-02-04: qty 30

## 2024-02-04 MED ORDER — METHOCARBAMOL 1000 MG/10ML IJ SOLN
INTRAMUSCULAR | Status: AC
  Filled 2024-02-04: qty 10

## 2024-02-04 MED ORDER — DOCUSATE SODIUM 100 MG PO CAPS: 200.0000 mg | ORAL_CAPSULE | Freq: Four times a day (QID) | ORAL | Status: DC | PRN

## 2024-02-04 MED ORDER — TRAZODONE HCL 50 MG PO TABS: 300.0000 mg | ORAL_TABLET | Freq: Every day | ORAL | Status: DC

## 2024-02-04 MED ORDER — THROMBIN 5000 UNITS EX KIT
PACK | CUTANEOUS | Status: AC
  Filled 2024-02-04: qty 2

## 2024-02-04 MED ORDER — ORAL CARE MOUTH RINSE: 15.0000 mL | Freq: Once | OROMUCOSAL | Status: AC

## 2024-02-04 MED ORDER — THROMBIN (RECOMBINANT) 5000 UNITS EX SOLR: CUTANEOUS | Status: DC | PRN

## 2024-02-04 MED ORDER — SODIUM CHLORIDE 0.9% FLUSH: 3.0000 mL | Freq: Two times a day (BID) | INTRAVENOUS | Status: DC

## 2024-02-04 MED ORDER — HYDROMORPHONE HCL 1 MG/ML IJ SOLN: 1.0000 mg | INTRAMUSCULAR | Status: DC | PRN

## 2024-02-04 MED ORDER — ACETAMINOPHEN 10 MG/ML IV SOLN: 1000.0000 mg | Freq: Once | INTRAVENOUS | Status: DC | PRN

## 2024-02-04 MED ORDER — ONDANSETRON HCL 4 MG/2ML IJ SOLN
INTRAMUSCULAR | Status: AC
  Filled 2024-02-04: qty 2

## 2024-02-04 MED ORDER — FENTANYL CITRATE (PF) 100 MCG/2ML IJ SOLN
INTRAMUSCULAR | Status: AC
  Filled 2024-02-04: qty 2

## 2024-02-04 MED ORDER — ONDANSETRON HCL 4 MG/2ML IJ SOLN: 4.0000 mg | Freq: Once | INTRAMUSCULAR | Status: DC | PRN

## 2024-02-04 MED ORDER — BUPIVACAINE HCL (PF) 0.25 % IJ SOLN
INTRAMUSCULAR | Status: DC | PRN
  Administered 2024-02-04: 20 mL

## 2024-02-04 MED ORDER — ONDANSETRON HCL 4 MG/2ML IJ SOLN: 4.0000 mg | Freq: Four times a day (QID) | INTRAMUSCULAR | Status: DC | PRN

## 2024-02-04 MED ORDER — MENTHOL 3 MG MT LOZG: 1.0000 | LOZENGE | OROMUCOSAL | Status: DC | PRN

## 2024-02-04 MED ORDER — GABAPENTIN 400 MG PO CAPS: 400.0000 mg | ORAL_CAPSULE | Freq: Every day | ORAL | Status: DC

## 2024-02-04 MED ORDER — FENTANYL CITRATE (PF) 250 MCG/5ML IJ SOLN
INTRAMUSCULAR | Status: DC | PRN
  Administered 2024-02-04: 50 ug via INTRAVENOUS
  Administered 2024-02-04: 100 ug via INTRAVENOUS
  Administered 2024-02-04 (×2): 50 ug via INTRAVENOUS

## 2024-02-04 MED ORDER — BUSPIRONE HCL 10 MG PO TABS: 20.0000 mg | ORAL_TABLET | Freq: Every evening | ORAL | Status: DC

## 2024-02-04 MED ORDER — LACTATED RINGERS IV SOLN: INTRAVENOUS | Status: DC

## 2024-02-04 MED ORDER — CHLORHEXIDINE GLUCONATE CLOTH 2 % EX PADS: 6.0000 | MEDICATED_PAD | Freq: Once | CUTANEOUS | Status: DC

## 2024-02-04 MED ORDER — ACETAMINOPHEN 10 MG/ML IV SOLN
INTRAVENOUS | Status: DC | PRN
  Administered 2024-02-04: 1000 mg via INTRAVENOUS

## 2024-02-04 MED ORDER — LIDOCAINE 2% (20 MG/ML) 5 ML SYRINGE
INTRAMUSCULAR | Status: DC | PRN
  Administered 2024-02-04: 60 mg via INTRAVENOUS

## 2024-02-04 MED ORDER — CHLORHEXIDINE GLUCONATE 0.12 % MT SOLN: 15.0000 mL | Freq: Once | OROMUCOSAL | Status: AC

## 2024-02-04 MED ORDER — PRAVASTATIN SODIUM 40 MG PO TABS: 80.0000 mg | ORAL_TABLET | Freq: Every day | ORAL | Status: DC

## 2024-02-04 MED ORDER — SODIUM CHLORIDE 0.9 % IV SOLN
250.0000 mL | INTRAVENOUS | Status: DC
  Administered 2024-02-04: 250 mL via INTRAVENOUS

## 2024-02-04 MED ORDER — CHLORHEXIDINE GLUCONATE 0.12 % MT SOLN
OROMUCOSAL | Status: AC
  Administered 2024-02-04: 15 mL via OROMUCOSAL
  Filled 2024-02-04: qty 15

## 2024-02-04 MED ORDER — ONDANSETRON HCL 4 MG/2ML IJ SOLN
INTRAMUSCULAR | Status: DC | PRN
  Administered 2024-02-04: 4 mg via INTRAVENOUS

## 2024-02-04 MED ORDER — LOSARTAN POTASSIUM 50 MG PO TABS: 50.0000 mg | ORAL_TABLET | Freq: Every day | ORAL | Status: DC

## 2024-02-04 MED ORDER — ZIPRASIDONE HCL 40 MG PO CAPS
40.0000 mg | ORAL_CAPSULE | Freq: Every day | ORAL | Status: DC
  Filled 2024-02-04: qty 1

## 2024-02-04 MED ORDER — MAGNESIUM OXIDE -MG SUPPLEMENT 400 (240 MG) MG PO TABS: 400.0000 mg | ORAL_TABLET | Freq: Every evening | ORAL | Status: DC

## 2024-02-04 MED ORDER — OYSTER SHELL CALCIUM/D3 500-5 MG-MCG PO TABS: 1.0000 | ORAL_TABLET | Freq: Two times a day (BID) | ORAL | Status: DC

## 2024-02-04 MED ORDER — DULOXETINE HCL 60 MG PO CPEP: 60.0000 mg | ORAL_CAPSULE | Freq: Every morning | ORAL | Status: DC

## 2024-02-04 MED ORDER — LIDOCAINE 2% (20 MG/ML) 5 ML SYRINGE
INTRAMUSCULAR | Status: AC
  Filled 2024-02-04: qty 5

## 2024-02-04 MED ORDER — HYDROXYZINE HCL 10 MG PO TABS
10.0000 mg | ORAL_TABLET | Freq: Every day | ORAL | Status: DC
  Filled 2024-02-04: qty 1

## 2024-02-04 MED ORDER — DEXAMETHASONE SODIUM PHOSPHATE 10 MG/ML IJ SOLN
INTRAMUSCULAR | Status: AC
  Filled 2024-02-04: qty 1

## 2024-02-04 MED ORDER — FENTANYL CITRATE (PF) 100 MCG/2ML IJ SOLN
25.0000 ug | INTRAMUSCULAR | Status: DC | PRN
  Administered 2024-02-04 (×2): 50 ug via INTRAVENOUS

## 2024-02-04 MED ORDER — DEXAMETHASONE 0.75 MG PO TABS: 0.7500 mg | ORAL_TABLET | Freq: Every morning | ORAL | Status: DC

## 2024-02-04 MED ORDER — ACETAMINOPHEN 10 MG/ML IV SOLN
INTRAVENOUS | Status: AC
  Filled 2024-02-04: qty 100

## 2024-02-04 MED ORDER — ROCURONIUM BROMIDE 10 MG/ML (PF) SYRINGE
PREFILLED_SYRINGE | INTRAVENOUS | Status: AC
  Filled 2024-02-04: qty 10

## 2024-02-04 MED ORDER — MELATONIN 5 MG PO TABS: 10.0000 mg | ORAL_TABLET | Freq: Every day | ORAL | Status: DC

## 2024-02-04 MED ORDER — MIDAZOLAM HCL 2 MG/2ML IJ SOLN
INTRAMUSCULAR | Status: DC | PRN
  Administered 2024-02-04: 1 mg via INTRAVENOUS

## 2024-02-04 MED ORDER — BACLOFEN 10 MG PO TABS
20.0000 mg | ORAL_TABLET | Freq: Two times a day (BID) | ORAL | Status: DC
  Administered 2024-02-04: 20 mg via ORAL
  Filled 2024-02-04: qty 2

## 2024-02-04 MED ORDER — DEXAMETHASONE SODIUM PHOSPHATE 10 MG/ML IJ SOLN
INTRAMUSCULAR | Status: DC | PRN
  Administered 2024-02-04: 8 mg via INTRAVENOUS

## 2024-02-04 MED ORDER — THROMBIN 5000 UNITS EX KIT
PACK | CUTANEOUS | Status: AC
  Filled 2024-02-04: qty 1

## 2024-02-04 MED ORDER — HYDROXYCHLOROQUINE SULFATE 200 MG PO TABS
200.0000 mg | ORAL_TABLET | Freq: Two times a day (BID) | ORAL | Status: DC
  Filled 2024-02-04 (×2): qty 1

## 2024-02-04 MED ORDER — AMISULPRIDE (ANTIEMETIC) 5 MG/2ML IV SOLN: 10.0000 mg | Freq: Once | INTRAVENOUS | Status: DC | PRN

## 2024-02-04 MED ORDER — 0.9 % SODIUM CHLORIDE (POUR BTL) OPTIME
TOPICAL | Status: DC | PRN
  Administered 2024-02-04: 1000 mL

## 2024-02-04 MED ORDER — ROCURONIUM BROMIDE 10 MG/ML (PF) SYRINGE
PREFILLED_SYRINGE | INTRAVENOUS | Status: DC | PRN
  Administered 2024-02-04: 60 mg via INTRAVENOUS

## 2024-02-04 MED ORDER — ONDANSETRON HCL 4 MG PO TABS: 4.0000 mg | ORAL_TABLET | Freq: Four times a day (QID) | ORAL | Status: DC | PRN

## 2024-02-04 MED ORDER — HYDROMORPHONE HCL 1 MG/ML IJ SOLN
INTRAMUSCULAR | Status: AC
  Filled 2024-02-04: qty 0.5

## 2024-02-04 MED ORDER — MIDAZOLAM HCL 2 MG/2ML IJ SOLN
INTRAMUSCULAR | Status: AC
  Filled 2024-02-04: qty 2

## 2024-02-04 MED ORDER — SODIUM CHLORIDE 0.9% FLUSH: 3.0000 mL | INTRAVENOUS | Status: DC | PRN

## 2024-02-04 MED ORDER — SODIUM CHLORIDE 0.9 % IV SOLN: INTRAVENOUS | Status: DC | PRN

## 2024-02-04 MED ORDER — PHENOL 1.4 % MT LIQD: 1.0000 | OROMUCOSAL | Status: DC | PRN

## 2024-02-04 MED ORDER — OXYCODONE HCL 5 MG PO TABS
10.0000 mg | ORAL_TABLET | ORAL | Status: DC | PRN
  Filled 2024-02-04: qty 2

## 2024-02-04 MED ORDER — CYCLOBENZAPRINE HCL 10 MG PO TABS: 10.0000 mg | ORAL_TABLET | Freq: Three times a day (TID) | ORAL | Status: DC | PRN

## 2024-02-04 MED ORDER — SUGAMMADEX SODIUM 200 MG/2ML IV SOLN
INTRAVENOUS | Status: DC | PRN
  Administered 2024-02-04: 200 mg via INTRAVENOUS

## 2024-02-04 MED ORDER — FENTANYL CITRATE (PF) 250 MCG/5ML IJ SOLN
INTRAMUSCULAR | Status: AC
  Filled 2024-02-04: qty 5

## 2024-02-04 MED ORDER — ACETAMINOPHEN 325 MG PO TABS: 650.0000 mg | ORAL_TABLET | ORAL | Status: DC | PRN

## 2024-02-04 MED ORDER — HYDROMORPHONE HCL 1 MG/ML IJ SOLN
INTRAMUSCULAR | Status: DC | PRN
  Administered 2024-02-04: .5 mg via INTRAVENOUS

## 2024-02-04 MED ORDER — HYDROCORTISONE SOD SUC (PF) 100 MG IJ SOLR
50.0000 mg | Freq: Once | INTRAMUSCULAR | Status: AC
  Administered 2024-02-04: 50 mg via INTRAVENOUS
  Filled 2024-02-04: qty 1

## 2024-02-04 SURGICAL SUPPLY — 42 items
BAG COUNTER SPONGE SURGICOUNT (BAG) ×1 IMPLANT
BAND RUBBER #18 3X1/16 STRL (MISCELLANEOUS) ×2 IMPLANT
BENZOIN TINCTURE PRP APPL 2/3 (GAUZE/BANDAGES/DRESSINGS) ×1 IMPLANT
BLADE CLIPPER SURG (BLADE) IMPLANT
BUR CUTTER 7.0 ROUND (BURR) ×1 IMPLANT
CANISTER SUCT 3000ML PPV (MISCELLANEOUS) ×1 IMPLANT
DERMABOND ADVANCED .7 DNX12 (GAUZE/BANDAGES/DRESSINGS) ×1 IMPLANT
DRAPE HALF SHEET 40X57 (DRAPES) IMPLANT
DRAPE LAPAROTOMY 100X72X124 (DRAPES) ×1 IMPLANT
DRAPE MICROSCOPE SLANT 54X150 (MISCELLANEOUS) ×1 IMPLANT
DRAPE SURG 17X23 STRL (DRAPES) ×2 IMPLANT
DRSG OPSITE POSTOP 3X4 (GAUZE/BANDAGES/DRESSINGS) IMPLANT
DRSG OPSITE POSTOP 4X6 (GAUZE/BANDAGES/DRESSINGS) IMPLANT
DURAPREP 26ML APPLICATOR (WOUND CARE) ×1 IMPLANT
ELECTRODE REM PT RTRN 9FT ADLT (ELECTROSURGICAL) ×1 IMPLANT
GAUZE 4X4 16PLY ~~LOC~~+RFID DBL (SPONGE) IMPLANT
GLOVE BIO SURGEON STRL SZ 6 (GLOVE) ×1 IMPLANT
GLOVE BIOGEL PI IND STRL 6.5 (GLOVE) ×1 IMPLANT
GLOVE BIOGEL PI IND STRL 7.5 (GLOVE) IMPLANT
GLOVE ECLIPSE 9.0 STRL (GLOVE) ×1 IMPLANT
GLOVE EXAM NITRILE XL STR (GLOVE) IMPLANT
GLOVE SURG SS PI 8.0 STRL IVOR (GLOVE) IMPLANT
GOWN STRL REUS W/ TWL LRG LVL3 (GOWN DISPOSABLE) IMPLANT
GOWN STRL REUS W/ TWL XL LVL3 (GOWN DISPOSABLE) ×1 IMPLANT
GOWN STRL REUS W/TWL 2XL LVL3 (GOWN DISPOSABLE) IMPLANT
KIT BASIN OR (CUSTOM PROCEDURE TRAY) ×1 IMPLANT
KIT TURNOVER KIT B (KITS) ×1 IMPLANT
NDL HYPO 22X1.5 SAFETY MO (MISCELLANEOUS) ×1 IMPLANT
NDL SPNL 22GX3.5 QUINCKE BK (NEEDLE) IMPLANT
NEEDLE HYPO 22X1.5 SAFETY MO (MISCELLANEOUS) ×1 IMPLANT
NEEDLE SPNL 22GX3.5 QUINCKE BK (NEEDLE) IMPLANT
NS IRRIG 1000ML POUR BTL (IV SOLUTION) ×1 IMPLANT
PACK LAMINECTOMY NEURO (CUSTOM PROCEDURE TRAY) ×1 IMPLANT
PAD ARMBOARD POSITIONER FOAM (MISCELLANEOUS) ×3 IMPLANT
SPIKE FLUID TRANSFER (MISCELLANEOUS) ×1 IMPLANT
SPONGE SURGIFOAM ABS GEL SZ50 (HEMOSTASIS) ×1 IMPLANT
STRIP CLOSURE SKIN 1/2X4 (GAUZE/BANDAGES/DRESSINGS) ×1 IMPLANT
SUT VIC AB 2-0 CT1 18 (SUTURE) ×1 IMPLANT
SUT VIC AB 3-0 SH 8-18 (SUTURE) ×1 IMPLANT
TOWEL GREEN STERILE (TOWEL DISPOSABLE) ×1 IMPLANT
TOWEL GREEN STERILE FF (TOWEL DISPOSABLE) ×1 IMPLANT
WATER STERILE IRR 1000ML POUR (IV SOLUTION) ×1 IMPLANT

## 2024-02-04 NOTE — H&P (Signed)
 Felicia Ponce is an 68 y.o. female.   Chief Complaint: Right leg pain HPI: 68 year old female remotely status post L4-5 and L3-4 decompression and fusion presents with persistent right lower extremity radicular pain consistent with a right-sided S1 radiculopathy.  Workup demonstrates evidence of progressive lateral recess stenosis on the right at L5-S1 without evidence of disc herniation or instability.  Patient presents now for right-sided L5-S1 decompressive laminotomy and foraminotomy in hopes improving her symptoms.  Past Medical History:  Diagnosis Date   Adrenal insufficiency (HCC)    Anxiety    Arthritis    Cervical stenosis of spine    Complication of anesthesia    pt states after left knee surgery in 2023 she "just went out and had to be brought back to life" but has had surgery since then and had no issues   Depression    Fibromyalgia    GERD (gastroesophageal reflux disease)    History of hiatal hernia    Hypertension    Hyperthyroidism    Hypoglycemia    Myocardial infarction (HCC)    mild in 2011, is not required to see cardiology now   Pneumonia    Polyarthralgia    inflammatory, with positive RA   Pre-diabetes    related to long term steroid use   Sleep apnea    does not wear CPAP   Spondylolisthesis of lumbar region    TIA (transient ischemic attack)    x2    Past Surgical History:  Procedure Laterality Date   ABDOMINAL HYSTERECTOMY     ANTERIOR CERVICAL DECOMP/DISCECTOMY FUSION N/A 09/26/2018   Procedure: Anterior Cervical Discectomy Fusion Cervical Four-Cervical Five - Cervical Five-Cervical Six - Cervical Six-Cervical Seven;  Surgeon: Agustina Aldrich, MD;  Location: [facility] OR;  Service: Neurosurgery;  Laterality: N/A;  Anterior Cervical Discectomy Fusion Cervical Four-Cervical Five - Cervical Five-Cervical Six - Cervical Six-Cervical Seven   APPENDECTOMY     BACK SURGERY     CARDIAC CATHETERIZATION     CHOLECYSTECTOMY     COLONOSCOPY     DILATION AND  CURETTAGE OF UTERUS     EYE SURGERY     bilateral cataract removal and lasix   HIP ARTHROSCOPY W/ LABRAL REPAIR Right    JOINT REPLACEMENT Left    knee replacement- howe   OOPHORECTOMY     Bilateral   OVARIAN CYST REMOVAL     TOE SURGERY Right    2 toes on right foot- had to be straightened   TONSILLECTOMY      History reviewed. No pertinent family history. Social History:  reports that she has never smoked. She has never used smokeless tobacco. She reports that she does not drink alcohol  and does not use drugs.  Allergies:  Allergies  Allergen Reactions   Penicillins Rash and Other (See Comments)         Ciprofloxacin Nausea Only, Rash and Other (See Comments)    DIZZINESS   Nsaids Rash and Other (See Comments)    ULCERS    Other Other (See Comments)    Chlorine water  - burns skin    Tilactase Other (See Comments)    UNSPECIFIED REACTION    Aspirin Rash   Latex Rash   Pollen Extract Other (See Comments)    Stuffy nose   Savella [Milnacipran Hcl] Rash    DIZZINESS, WEAKNESS   Sulfa Antibiotics Rash   Sulfamethoxazole-Trimethoprim Rash   Sulfasalazine Rash   Tape Rash and Other (See Comments)    Can use paper  tape   Tolmetin Rash   Topiramate     Confusion (intolerance)    Medications Prior to Admission  Medication Sig Dispense Refill   baclofen  (LIORESAL ) 20 MG tablet Take 20 mg by mouth 2 (two) times daily.     busPIRone  (BUSPAR ) 10 MG tablet Take 20 mg by mouth every evening.     Calcium  Carb-Cholecalciferol  600-12.5 MG-MCG TABS Take 1 tablet by mouth 2 (two) times daily.     dexamethasone  (DECADRON ) 0.75 MG tablet Take 0.75 mg by mouth every morning.     docusate sodium  (COLACE) 250 MG capsule Take 250 mg by mouth 4 (four) times daily as needed for constipation. When taking oxycodone      DULoxetine  (CYMBALTA ) 60 MG capsule Take 60 mg by mouth every morning.     folic acid  (FOLVITE ) 1 MG tablet Take 1 mg by mouth daily.     gabapentin  (NEURONTIN ) 400 MG  capsule Take 400 mg by mouth at bedtime.     hydroxychloroquine  (PLAQUENIL ) 200 MG tablet Take 200 mg by mouth 2 (two) times daily.     hydrOXYzine  (ATARAX ) 10 MG tablet Take 10 mg by mouth at bedtime.     losartan  (COZAAR ) 50 MG tablet Take 50 mg by mouth daily.     Magnesium  400 MG TABS Take 400 mg by mouth every evening.     Melatonin 5 MG CAPS Take 10 mg by mouth at bedtime.      methotrexate  (RHEUMATREX) 2.5 MG tablet Take 15 mg by mouth every Friday.     oxyCODONE  (ROXICODONE ) 15 MG immediate release tablet Take 15 mg by mouth every 3 (three) hours as needed for pain.     OZEMPIC, 1 MG/DOSE, 4 MG/3ML SOPN Inject 1 mg as directed every Wednesday.     pravastatin  (PRAVACHOL ) 80 MG tablet Take 80 mg by mouth daily.     promethazine  (PHENERGAN ) 25 MG tablet Take 25 mg by mouth every 6 (six) hours as needed for nausea or vomiting.     sennosides-docusate sodium  (SENOKOT-S) 8.6-50 MG tablet Take 1 tablet by mouth daily. May take a second tablet during the day as needed for constipation     trazodone  (DESYREL ) 300 MG tablet Take 300 mg by mouth at bedtime.     ziprasidone  (GEODON ) 40 MG capsule Take 40 mg by mouth at bedtime.     polyethylene glycol (MIRALAX  / GLYCOLAX ) 17 g packet Take 17 g by mouth daily as needed for mild constipation or moderate constipation.      Results for orders placed or performed during the hospital encounter of 02/02/24 (from the past 48 hours)  Surgical pcr screen     Status: None   Collection Time: 02/02/24 11:34 AM   Specimen: Nasal Mucosa; Nasal Swab  Result Value Ref Range   MRSA, PCR NEGATIVE NEGATIVE   Staphylococcus aureus NEGATIVE NEGATIVE    Comment: (NOTE) The Xpert SA Assay (FDA approved for NASAL specimens in patients 41 years of age and older), is one component of a comprehensive surveillance program. It is not intended to diagnose infection nor to guide or monitor treatment. Performed at [facility], [address] [place],  [place] [address]   Basic metabolic panel per protocol     Status: None   Collection Time: 02/02/24 12:00 PM  Result Value Ref Range   Sodium 139 135 - 145 mmol/L   Potassium 4.1 3.5 - 5.1 mmol/L   Chloride 106 98 - 111 mmol/L  CO2 23 22 - 32 mmol/L   Glucose, Bld 98 70 - 99 mg/dL    Comment: Glucose reference range applies only to samples taken after fasting for at least 8 hours.   BUN 11 8 - 23 mg/dL   Creatinine, Ser 4.40 0.44 - 1.00 mg/dL   Calcium  8.9 8.9 - 10.3 mg/dL   GFR, Estimated >10 >27 mL/min    Comment: (NOTE) Calculated using the CKD-EPI Creatinine Equation (2021)    Anion gap 10 5 - 15    Comment: Performed at [facility], [address], [place], [place] [address]  CBC per protocol     Status: Abnormal   Collection Time: 02/02/24 12:00 PM  Result Value Ref Range   WBC 9.8 4.0 - 10.5 K/uL   RBC 5.13 (H) 3.87 - 5.11 MIL/uL   Hemoglobin 14.9 12.0 - 15.0 g/dL   HCT 44.0 (H) 34.7 - 42.5 %   MCV 91.4 80.0 - 100.0 fL   MCH 29.0 26.0 - 34.0 pg   MCHC 31.8 30.0 - 36.0 g/dL   RDW 95.6 38.7 - 56.4 %   Platelets 295 150 - 400 K/uL   nRBC 0.0 0.0 - 0.2 %    Comment: Performed at [facility], [address] [place], [place] [address]   No results found.  Pertinent items noted in HPI and remainder of comprehensive ROS otherwise negative.  Blood pressure 139/68, pulse 89, temperature 98.2 F (36.8 C), temperature source Oral, resp. rate 18, height 5\' 5"  (1.651 m), weight 86.2 kg, SpO2 94%.  Patient is awake and alert.  She is oriented and appropriate.  Speech is fluent.  Judgment insight are intact.  Cranial nerve function normal by.  Motor examination reveals intact motor strength bilateral.  Sensory examination with some decrease sensation in the right S1 dermatome.  Straight raising equivocal on the right negative on the left.  Deep tendon rhexis hypoactive except her Achilles reflexes are absent.  Gait antalgic.  Posture reasonably normal peer examination head  ears eyes nose throat is unremarkable chest and abdomen are benign.  Extremities are free from injury deformity. 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  Plan right L5-S1 decompressive laminotomy and foraminotomies.  Risks and benefits been explained.  Patient wishes to proceed.  Felicia Ponce 02/04/2024, 10:19 AM

## 2024-02-04 NOTE — Anesthesia Procedure Notes (Signed)
 Procedure Name: Intubation Date/Time: 02/04/2024 11:10 AM  Performed by: Deontrey Massi C, CRNAPre-anesthesia Checklist: Patient identified, Emergency Drugs available, Suction available and Patient being monitored Patient Re-evaluated:Patient Re-evaluated prior to induction Oxygen Delivery Method: Circle System Utilized Preoxygenation: Pre-oxygenation with 100% oxygen Induction Type: IV induction Ventilation: Mask ventilation without difficulty Laryngoscope Size: Glidescope and 4 Grade View: Grade I Tube type: Oral Tube size: 7.0 mm Number of attempts: 1 Airway Equipment and Method: Stylet and Oral airway Placement Confirmation: ETT inserted through vocal cords under direct vision, positive ETCO2 and breath sounds checked- equal and bilateral Secured at: 21 cm Tube secured with: Tape Dental Injury: Teeth and Oropharynx as per pre-operative assessment

## 2024-02-04 NOTE — Evaluation (Signed)
 Occupational Therapy Evaluation Patient Details Name: Felicia Ponce MRN: [id number] DOB: [birth year] Today's Date: 02/04/2024   History of Present Illness   Pt is a 68 yo female s/p elective L5-S1 decompressive laminotomy. PMH significant for back surgery, ACDF (2019), GERD, fibromyalgia, HTN, MI (2011), pre-diabetes, TIA.     Clinical Impressions Pt admitted for above, PTA pt reports ambulating no AD and being ind with ADLs, her husband performs home iADLs. Pt currently denying back pain but does report pain in R hip and R knee that impact her gait performance. Pt more stable and ambulatory with Supervision using RW, discussed using her personal RW at home for balance and pain relieve. Educated pt on back precautions, she verbalized recall of 3/3 and demonstrated good use of compensatory strategies for ADLs and transfers. Pt completing ADLs with setup/supervision. Pt has sound knowledge of all education, no further acute skilled OT needs identified. No post acute OT needed.      If plan is discharge home, recommend the following:   Assist for transportation;Assistance with cooking/housework     Functional Status Assessment   Patient has had a recent decline in their functional status and/or demonstrates limited ability to make significant improvements in function in a reasonable and predictable amount of time     Equipment Recommendations   Tub/shower seat     Recommendations for Other Services         Precautions/Restrictions   Precautions Precautions: Back Precaution Booklet Issued: No Recall of Precautions/Restrictions: Intact Precaution/Restrictions Comments: Pt recalls 3/3 precautions without cueing or handout Required Braces or Orthoses:  (no brace needed) Restrictions Weight Bearing Restrictions Per Provider Order: No     Mobility Bed Mobility               General bed mobility comments: Pt sitting EOB on arrival    Transfers Overall  transfer level: Needs assistance Equipment used: None Transfers: Sit to/from Stand Sit to Stand: Supervision           General transfer comment: STSx2 from EOB with supervision, RW for gait to help relieve R hip pain      Balance Overall balance assessment: Mild deficits observed, not formally tested                                         ADL either performed or assessed with clinical judgement   ADL Overall ADL's : Needs assistance/impaired Eating/Feeding: Independent;Sitting   Grooming: Supervision/safety;Standing Grooming Details (indicate cue type and reason): Educated pt on use of cups for spit/rinse during oral care Upper Body Bathing: Independent;Sitting   Lower Body Bathing: Sitting/lateral leans;Set up Lower Body Bathing Details (indicate cue type and reason): able to perform figure four, discussed benefit of seat for shower Upper Body Dressing : Sitting;Set up   Lower Body Dressing: Set up;Sitting/lateral leans;Sit to/from stand;Supervision/safety Lower Body Dressing Details (indicate cue type and reason): donned underwear, pants, and slip on shoes. Toilet Transfer: Supervision/safety;Rolling walker (2 wheels);Ambulation   Toileting- Clothing Manipulation and Hygiene: Supervision/safety;Sit to/from stand Toileting - Clothing Manipulation Details (indicate cue type and reason): Pt demonstrated ability to reach bottom without twisting     Functional mobility during ADLs: Supervision/safety;Rolling walker (2 wheels) General ADL Comments: Discussed car transfer with pt, she owns a handy bar to assist. Verbalized understanding of car transfer to which she reports doing at baseline     Vision  Perception         Praxis         Pertinent Vitals/Pain Pain Assessment Pain Assessment: Faces Faces Pain Scale: Hurts little more Pain Location: R knee and R hip Pain Descriptors / Indicators: Aching Pain Intervention(s): Monitored during  session, Limited activity within patient's tolerance, Repositioned     Extremity/Trunk Assessment Upper Extremity Assessment Upper Extremity Assessment: Overall WFL for tasks assessed   Lower Extremity Assessment Lower Extremity Assessment: Overall WFL for tasks assessed   Cervical / Trunk Assessment Cervical / Trunk Assessment: Back Surgery   Communication Communication Communication: No apparent difficulties   Cognition Arousal: Alert Behavior During Therapy: WFL for tasks assessed/performed Cognition: No apparent impairments                               Following commands: Intact       Cueing  General Comments   Cueing Techniques: Verbal cues  VSS. Pt husband present and supportive   Exercises     Shoulder Instructions      Home Living Family/patient expects to be discharged to:: Private residence Living Arrangements: Spouse/significant other Available Help at Discharge: Family;Available 24 hours/day Type of Home: Mobile home Home Access: Stairs to enter Entrance Stairs-Number of Steps: 2 Entrance Stairs-Rails: None Home Layout: One level     Bathroom Shower/Tub: Chief Strategy Officer: Standard     Home Equipment: Agricultural consultant (2 wheels);Cane - single point;BSC/3in1          Prior Functioning/Environment Prior Level of Function : Independent/Modified Independent             Mobility Comments: amb no AD ADLs Comments: Ind with ADLs, husband assists with house work    OT Problem List: Pain   OT Treatment/Interventions:        OT Goals(Current goals can be found in the care plan section)   Acute Rehab OT Goals Patient Stated Goal: To go home OT Goal Formulation: With patient/family Time For Goal Achievement: 02/18/24 Potential to Achieve Goals: Good   OT Frequency:       Co-evaluation              AM-PAC OT "6 Clicks" Daily Activity     Outcome Measure Help from another person eating meals?:  None Help from another person taking care of personal grooming?: A Little Help from another person toileting, which includes using toliet, bedpan, or urinal?: A Little Help from another person bathing (including washing, rinsing, drying)?: A Little Help from another person to put on and taking off regular upper body clothing?: A Little Help from another person to put on and taking off regular lower body clothing?: A Little 6 Click Score: 19   End of Session Equipment Utilized During Treatment: Gait belt;Rolling walker (2 wheels) Nurse Communication: Mobility status  Activity Tolerance: Patient tolerated treatment well Patient left: in bed;with call bell/phone within reach (sitting EOB)  OT Visit Diagnosis: Pain Pain - Right/Left:  (back)                Time: 1725-1741 OT Time Calculation (min): 16 min Charges:  OT General Charges $OT Visit: 1 Visit OT Evaluation $OT Eval Low Complexity: 1 Low  02/04/2024  AB, OTR/L  Acute Rehabilitation Services  Office: [phone]   Jorene New 02/04/2024, 5:51 PM

## 2024-02-04 NOTE — Discharge Summary (Signed)
 Physician Discharge Summary  Patient ID: Felicia Ponce MRN: [id number] DOB/AGE: [birth year] 68 y.o.  Admit date: 02/04/2024 Discharge date: 02/04/2024  Admission Diagnoses:  Discharge Diagnoses:  Principal Problem:   Lumbar radiculopathy   Discharged Condition: good  Hospital Course: Patient mid to the hospital where she underwent uncomplicated right-sided L5-S1 decompressive surgery.  Postoperative doing well.  Back in lower extremity symptoms relieved.  Patient standing ambulating and voiding without difficulty.  Ready for discharge home.  Consults:   Significant Diagnostic Studies:   Treatments:   Discharge Exam: Blood pressure (!) 123/45, pulse 78, temperature 99.4 F (37.4 C), temperature source Oral, resp. rate 20, height 5\' 5"  (1.651 m), weight 86.2 kg, SpO2 100%. Awake and alert.  Oriented and appropriate.  Motor and sensory function intact.  Wound clean and dry.  Chest and abdomen benign  Disposition: Discharge disposition: 01-Home or Self Care        Allergies as of 02/04/2024       Reactions   Penicillins Rash, Other (See Comments)      Ciprofloxacin Nausea Only, Rash, Other (See Comments)   DIZZINESS   Nsaids Rash, Other (See Comments)   ULCERS   Other Other (See Comments)   Chlorine water  - burns skin   Tilactase Other (See Comments)   UNSPECIFIED REACTION    Aspirin Rash   Latex Rash   Pollen Extract Other (See Comments)   Stuffy nose   Savella [milnacipran Hcl] Rash   DIZZINESS, WEAKNESS   Sulfa Antibiotics Rash   Sulfamethoxazole-trimethoprim Rash   Sulfasalazine Rash   Tape Rash, Other (See Comments)   Can use paper tape   Tolmetin Rash   Topiramate    Confusion (intolerance)        Medication List     TAKE these medications    baclofen  20 MG tablet Commonly known as: LIORESAL  Take 20 mg by mouth 2 (two) times daily.   busPIRone  10 MG tablet Commonly known as: BUSPAR  Take 20 mg by mouth every evening.   Calcium   Carb-Cholecalciferol  600-12.5 MG-MCG Tabs Take 1 tablet by mouth 2 (two) times daily.   dexamethasone  0.75 MG tablet Commonly known as: DECADRON  Take 0.75 mg by mouth every morning.   docusate sodium  250 MG capsule Commonly known as: COLACE Take 250 mg by mouth 4 (four) times daily as needed for constipation. When taking oxycodone    DULoxetine  60 MG capsule Commonly known as: CYMBALTA  Take 60 mg by mouth every morning.   folic acid  1 MG tablet Commonly known as: FOLVITE  Take 1 mg by mouth daily.   gabapentin  400 MG capsule Commonly known as: NEURONTIN  Take 400 mg by mouth at bedtime.   hydroxychloroquine  200 MG tablet Commonly known as: PLAQUENIL  Take 200 mg by mouth 2 (two) times daily.   hydrOXYzine  10 MG tablet Commonly known as: ATARAX  Take 10 mg by mouth at bedtime.   losartan  50 MG tablet Commonly known as: COZAAR  Take 50 mg by mouth daily.   Magnesium  400 MG Tabs Take 400 mg by mouth every evening.   Melatonin 5 MG Caps Take 10 mg by mouth at bedtime.   methotrexate  2.5 MG tablet Commonly known as: RHEUMATREX Take 15 mg by mouth every Friday.   oxyCODONE  15 MG immediate release tablet Commonly known as: ROXICODONE  Take 15 mg by mouth every 3 (three) hours as needed for pain.   Ozempic (1 MG/DOSE) 4 MG/3ML Sopn Generic drug: Semaglutide (1 MG/DOSE) Inject 1 mg as directed every Wednesday.  polyethylene glycol 17 g packet Commonly known as: MIRALAX  / GLYCOLAX  Take 17 g by mouth daily as needed for mild constipation or moderate constipation.   pravastatin  80 MG tablet Commonly known as: PRAVACHOL  Take 80 mg by mouth daily.   promethazine  25 MG tablet Commonly known as: PHENERGAN  Take 25 mg by mouth every 6 (six) hours as needed for nausea or vomiting.   sennosides-docusate sodium  8.6-50 MG tablet Commonly known as: SENOKOT-S Take 1 tablet by mouth daily. May take a second tablet during the day as needed for constipation   trazodone  300 MG  tablet Commonly known as: DESYREL  Take 300 mg by mouth at bedtime.   ziprasidone  40 MG capsule Commonly known as: GEODON  Take 40 mg by mouth at bedtime.         Signed: Awilda Bogus Jace Dowe 02/04/2024, 4:55 PM

## 2024-02-04 NOTE — Transfer of Care (Signed)
 Immediate Anesthesia Transfer of Care Note  Patient: Felicia Ponce  Procedure(s) Performed: LUMBAR LAMINECTOMY AND FORAMINOTOMY RIGHT LUMBAR FIVE-SACRAL ONE (Right: Back)  Patient Location: PACU  Anesthesia Type:General  Level of Consciousness: awake, alert , patient cooperative, and responds to stimulation  Airway & Oxygen Therapy: Patient Spontanous Breathing and Patient connected to face mask oxygen  Post-op Assessment: Report given to RN, Post -op Vital signs reviewed and stable, and Patient moving all extremities X 4  Post vital signs: Reviewed and stable  Last Vitals:  Vitals Value Taken Time  BP 124/49 02/04/24 1223  Temp    Pulse 77 02/04/24 1227  Resp 16 02/04/24 1227  SpO2 93 % 02/04/24 1227  Vitals shown include unfiled device data.  Last Pain:  Vitals:   02/04/24 0905  TempSrc:   PainSc: 6          Complications: No notable events documented.

## 2024-02-04 NOTE — Op Note (Signed)
 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  Asst. Surgeon: Sabra Cramp, NP  Anesthesia: General  Indication: 68 year old female remotely status post L3-4 and L4-5 decompression and fusion presents with right lower extremity radicular pain with some associated numbness.  Workup demonstrates evidence of significant spondylosis with stenosis at L5-S1 on the left.  No evidence of instability.  Patient is failed conservative management presents now for decompressive surgery.  Operative note: After induction of anesthesia, patient position prone onto Wilson frame and properly padded.  Lumbar region prepped and draped sterilely.  Incision made overlying L5-S1.  Dissection performed in the right.  Retractor placed.  X-ray taken.  Level confirmed.  Laminotomy performed using high-speed drill and Kerrison rongeurs to remove the inferior aspect of the lamina of L5 the medial aspect the L5-S1 facet joint and the superior aspect of the S1 lamina.  Ligamentum flavum elevated and resected.  Underlying thecal sac and right sided L5 and S1 nerve roots identified.  The roots themselves were conjoined.  They were dissected free.  Foraminotomies complete on the course the exiting L5 and S1 nerve roots.  At this point a very thorough decompression been achieved.  There was no evidence of any injury to the thecal sac and nerve roots.  Wound was irrigated.  Gelfoam placed topically for hemostasis.  Wounds then closed in layers with Vicryl sutures.  Steri-Strips and sterile dressing were applied.  No apparent complications.  Patient tolerated the procedure well and she returns to the recovery room postop.

## 2024-02-04 NOTE — Progress Notes (Signed)
 Patient alert and oriented, void, ambulate. Surgical site clean and dry no sign of infection. D/c instructions explain and given all questions answered.

## 2024-02-04 NOTE — Brief Op Note (Signed)
 02/04/2024  12:04 PM  PATIENT:  Felicia Ponce  68 y.o. 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  PROCEDURE:  Procedure(s) with comments: LUMBAR LAMINECTOMY AND FORAMINOTOMY RIGHT LUMBAR FIVE-SACRAL ONE (Right) - LUMBAR LAMINECTOMY AND FORAMINOTOMY RIGHT LUMBAR FIVE-SACRAL ONE  SURGEON:  Surgeons and Role:    Agustina Aldrich, MD - Primary  PHYSICIAN ASSISTANT:   ASSISTANTSAlena An   ANESTHESIA:   general  EBL:  20cc   BLOOD ADMINISTERED:none  DRAINS: none   LOCAL MEDICATIONS USED:  MARCAINE      SPECIMEN:  No Specimen  DISPOSITION OF SPECIMEN:  N/A  COUNTS:  YES  TOURNIQUET:  * No tourniquets in log *  DICTATION: .Dragon Dictation  PLAN OF CARE: Admit for overnight observation  PATIENT DISPOSITION:  PACU - hemodynamically stable.   Delay start of Pharmacological VTE agent (>24hrs) due to surgical blood loss or risk of bleeding: yes

## 2024-02-04 NOTE — Discharge Instructions (Addendum)
 Wound Care Keep incision covered and dry until post op day 3. You may remove the Honeycomb dressing on post op day 3. Leave steri-strips on back.  They will fall off by themselves. Do not put any creams, lotions, or ointments on incision. You are fine to shower. Let water  run over incision and pat dry.   Activity Walk each and every day, increasing distance each day. No lifting greater than 8 lbs.  No lifting no bending, and no twisting. 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  Diet Resume your normal diet.    Call Your Doctor If Any of These Occur Redness, drainage, or swelling at the wound.  Temperature greater than 101 degrees. Severe pain not relieved by pain medication. Incision starts to come apart.  Follow Up Appt Call [phone] if you have one or any problem.

## 2024-02-07 ENCOUNTER — Encounter (HOSPITAL_COMMUNITY): Payer: Self-pay | Admitting: Neurosurgery

## 2024-02-07 MED FILL — Thrombin For Soln 5000 Unit: CUTANEOUS | Qty: 2 | Status: AC

## 2024-02-07 NOTE — Anesthesia Postprocedure Evaluation (Signed)
 Anesthesia Post Note  Patient: Felicia Ponce  Procedure(s) Performed: LUMBAR LAMINECTOMY AND FORAMINOTOMY RIGHT LUMBAR FIVE-SACRAL ONE (Right: Back)     Patient location during evaluation: PACU Anesthesia Type: General Level of consciousness: awake Pain management: pain level controlled Vital Signs Assessment: post-procedure vital signs reviewed and stable Respiratory status: spontaneous breathing, nonlabored ventilation and respiratory function stable Cardiovascular status: blood pressure returned to baseline and stable Postop Assessment: no apparent nausea or vomiting Anesthetic complications: no   No notable events documented.  Last Vitals:  Vitals:   02/04/24 1342 02/04/24 1659  BP: (!) 123/45 111/61  Pulse: 78 86  Resp: 20 20  Temp: 37.4 C 37.3 C  SpO2: 100% 95%    Last Pain:  Vitals:   02/04/24 1659  TempSrc: Oral  PainSc:                  Reubin Bushnell P Joshva Labreck
